# Patient Record
Sex: Female | Born: 1935
Health system: Southern US, Community
[De-identification: ages and names within clinical notes are randomized; demographics above are authoritative.]

## PROBLEM LIST (undated history)

## (undated) DIAGNOSIS — M1990 Unspecified osteoarthritis, unspecified site: Secondary | ICD-10-CM

## (undated) DIAGNOSIS — T4145XA Adverse effect of unspecified anesthetic, initial encounter: Secondary | ICD-10-CM

## (undated) DIAGNOSIS — K2100 Gastro-esophageal reflux disease with esophagitis, without bleeding: Secondary | ICD-10-CM

## (undated) DIAGNOSIS — R0789 Other chest pain: Secondary | ICD-10-CM

## (undated) DIAGNOSIS — E7800 Pure hypercholesterolemia, unspecified: Secondary | ICD-10-CM

## (undated) DIAGNOSIS — H3530 Unspecified macular degeneration: Secondary | ICD-10-CM

## (undated) DIAGNOSIS — E0590 Thyrotoxicosis, unspecified without thyrotoxic crisis or storm: Secondary | ICD-10-CM

## (undated) DIAGNOSIS — Z87442 Personal history of urinary calculi: Secondary | ICD-10-CM

## (undated) DIAGNOSIS — Z8709 Personal history of other diseases of the respiratory system: Secondary | ICD-10-CM

## (undated) DIAGNOSIS — N1831 Chronic kidney disease, stage 3a: Secondary | ICD-10-CM

## (undated) DIAGNOSIS — M4800 Spinal stenosis, site unspecified: Secondary | ICD-10-CM

## (undated) DIAGNOSIS — I10 Essential (primary) hypertension: Secondary | ICD-10-CM

## (undated) DIAGNOSIS — D649 Anemia, unspecified: Secondary | ICD-10-CM

## (undated) DIAGNOSIS — D509 Iron deficiency anemia, unspecified: Secondary | ICD-10-CM

## (undated) DIAGNOSIS — T7840XA Allergy, unspecified, initial encounter: Secondary | ICD-10-CM

## (undated) DIAGNOSIS — K5900 Constipation, unspecified: Secondary | ICD-10-CM

## (undated) DIAGNOSIS — N289 Disorder of kidney and ureter, unspecified: Secondary | ICD-10-CM

## (undated) DIAGNOSIS — H409 Unspecified glaucoma: Secondary | ICD-10-CM

## (undated) DIAGNOSIS — K7689 Other specified diseases of liver: Secondary | ICD-10-CM

## (undated) DIAGNOSIS — R200 Anesthesia of skin: Secondary | ICD-10-CM

## (undated) DIAGNOSIS — F5101 Primary insomnia: Secondary | ICD-10-CM

## (undated) DIAGNOSIS — M12812 Other specific arthropathies, not elsewhere classified, left shoulder: Secondary | ICD-10-CM

## (undated) DIAGNOSIS — G479 Sleep disorder, unspecified: Secondary | ICD-10-CM

## (undated) DIAGNOSIS — M75101 Unspecified rotator cuff tear or rupture of right shoulder, not specified as traumatic: Secondary | ICD-10-CM

## (undated) DIAGNOSIS — L719 Rosacea, unspecified: Secondary | ICD-10-CM

## (undated) DIAGNOSIS — R112 Nausea with vomiting, unspecified: Secondary | ICD-10-CM

## (undated) DIAGNOSIS — M545 Low back pain, unspecified: Secondary | ICD-10-CM

## (undated) DIAGNOSIS — M19011 Primary osteoarthritis, right shoulder: Secondary | ICD-10-CM

## (undated) DIAGNOSIS — K219 Gastro-esophageal reflux disease without esophagitis: Secondary | ICD-10-CM

## (undated) DIAGNOSIS — Z8701 Personal history of pneumonia (recurrent): Secondary | ICD-10-CM

## (undated) DIAGNOSIS — T8859XA Other complications of anesthesia, initial encounter: Secondary | ICD-10-CM

## (undated) DIAGNOSIS — K625 Hemorrhage of anus and rectum: Secondary | ICD-10-CM

## (undated) DIAGNOSIS — M75102 Unspecified rotator cuff tear or rupture of left shoulder, not specified as traumatic: Secondary | ICD-10-CM

## (undated) DIAGNOSIS — E7849 Other hyperlipidemia: Secondary | ICD-10-CM

## (undated) HISTORY — DX: Disorder of kidney and ureter, unspecified: N28.9

## (undated) HISTORY — DX: Allergy, unspecified, initial encounter: T78.40XA

## (undated) HISTORY — DX: Gastro-esophageal reflux disease with esophagitis, without bleeding: K21.00

## (undated) HISTORY — DX: Unspecified glaucoma: H40.9

## (undated) HISTORY — DX: Unspecified osteoarthritis, unspecified site: M19.90

## (undated) HISTORY — DX: Chronic kidney disease, stage 3a: N18.31

## (undated) HISTORY — DX: Other chest pain: R07.89

## (undated) HISTORY — PX: ABDOMINAL HYSTERECTOMY: SHX81

## (undated) HISTORY — DX: Rosacea, unspecified: L71.9

## (undated) HISTORY — DX: Anemia, unspecified: D64.9

## (undated) HISTORY — DX: Primary insomnia: F51.01

## (undated) HISTORY — DX: Hemorrhage of anus and rectum: K62.5

## (undated) HISTORY — DX: Pure hypercholesterolemia, unspecified: E78.00

## (undated) HISTORY — DX: Other specified diseases of liver: K76.89

## (undated) HISTORY — DX: Low back pain, unspecified: M54.50

## (undated) HISTORY — PX: JOINT REPLACEMENT: SHX530

## (undated) HISTORY — DX: Thyrotoxicosis, unspecified without thyrotoxic crisis or storm: E05.90

## (undated) HISTORY — PX: EYE SURGERY: SHX253

## (undated) HISTORY — DX: Other hyperlipidemia: E78.49

## (undated) HISTORY — PX: CARPAL TUNNEL RELEASE: SHX101

## (undated) HISTORY — DX: Constipation, unspecified: K59.00

## (undated) HISTORY — PX: OTHER SURGICAL HISTORY: SHX169

## (undated) HISTORY — DX: Sleep disorder, unspecified: G47.9

## (undated) HISTORY — PX: DILATION AND CURETTAGE OF UTERUS: SHX78

## (undated) HISTORY — DX: Essential (primary) hypertension: I10

## (undated) HISTORY — PX: KNEE SURGERY: SHX244

---

## 2008-06-11 ENCOUNTER — Encounter: Admission: RE | Admit: 2008-06-11 | Discharge: 2008-06-11 | Payer: Self-pay | Admitting: Orthopedic Surgery

## 2008-06-14 ENCOUNTER — Ambulatory Visit (HOSPITAL_BASED_OUTPATIENT_CLINIC_OR_DEPARTMENT_OTHER): Admission: RE | Admit: 2008-06-14 | Discharge: 2008-06-15 | Payer: Self-pay | Admitting: Orthopedic Surgery

## 2010-08-04 LAB — BASIC METABOLIC PANEL
BUN: 19 mg/dL (ref 6–23)
Creatinine, Ser: 0.91 mg/dL (ref 0.4–1.2)
GFR calc non Af Amer: >60 mL/min (ref >60)

## 2010-08-04 LAB — POCT HEMOGLOBIN-HEMACUE: Hemoglobin: 11 g/dL — ABNORMAL LOW (ref 12.0–15.0)

## 2010-09-01 NOTE — Op Note (Signed)
Patricia [name], [name]                 ACCOUNT NO.:  [id number]   MEDICAL RECORD NO.:  [id number]          PATIENT TYPE:  AMB   LOCATION:  DSC                          FACILITY:  [facility]   PHYSICIAN:  Eulas Post, MD    DATE OF BIRTH:  08-21-1934   DATE OF PROCEDURE:  06/14/2008  DATE OF DISCHARGE:                               OPERATIVE REPORT   PREOPERATIVE DIAGNOSIS:  Left shoulder massive irreparable rotator cuff  tear with biceps tendinosis, impingement, and acromioclavicular joint  arthritis.   POSTOPERATIVE DIAGNOSIS:  Left shoulder massive irreparable rotator cuff  tear with biceps tendinosis, impingement, and acromioclavicular joint  arthritis.   OPERATIVE PROCEDURE:  Left shoulder arthroscopy with open biceps  subpectoral tenodesis and extensive debridement with acromioplasty and  distal clavicle resection.   PREOPERATIVE INDICATIONS:  Patricia [name] is a 76 year old woman who  had severe rotator cuff deficiency with a massive tear and extensive  atrophy of the subscapularis, supraspinatus and infraspinatus.  She  elected to undergo the above-named procedures.  The risks, benefits and  alternatives were discussed with her preoperatively including  but not  limited to risks of infection, bleeding, nerve injury, recurrent pain,  stiffness, progression of rotator cuff arthropathy, anterior escape,  cardiopulmonary complications, among others and she is willing to  proceed.   OPERATIVE FINDINGS:  The bone on the humeral side was relatively well  maintained.  The cartilage on the glenoid had grade 1 to grade 2  changes.  The anterior acromion had some acetabularization, and had a  fairly extensive spur anteriorly that became almost confluent with the  CA ligament.  The [facility] joint had degenerative arthritis.  The rotator cuff  was massively torn and this included the majority of the infraspinatus,  all of the supraspinatus, and some of the subscapularis.  These tendons  were  retracted well back beyond the glenoid and there was nothing to  repair.  The biceps tendon had severe tendinosis and in hour glass type  shape and the biceps tendon was subluxated upon entry into the joint.   OPERATIVE PROCEDURE:  The patient was brought to the operating room and  placed in a supine position.  A sloppy lateral decubitus position was  applied.  General anesthesia was administered.  Regional block was given  in the holding area.  One gram of intravenous Ancef was given.  The left  upper extremity was prepped and draped in the usual sterile fashion.  Foley was placed.  Diagnostic arthroscopy was carried out with the above-  named findings.  Extensive debridement was carried out using a shaver  removing the bursa and the frayed portions of the rotator cuff.  The  biceps was tenolized using an arthroscopic biter.  The labrum was  debrided with the shaver.  The undersurface of the acromion was debrided  and an acromioplasty was carried out using the bur.  Care was taken to  preserve the CA ligament and shell this out.  I was less aggressive with  the acromioplasty than I would normally be, in hopes of not  destabilizing  the shoulder.  There still may be somewhat of a slight  hook, but if I took the entire hook, she would be left with no CA  ligament and increase the risk for anterior escape.  Therefore, I did  perform a light acromioplasty with care and caution.   We then turned our attention to the [facility] joint and the distal clavicle was  resected.  There were superior portions of bone that were also excised  that appeared to be significant bone spurs.   We then turned our attention to the biceps tendon.  It had already been  tenotomized and a small anterior incision was made over the anterior  shoulder.  Dissection was carried down through the deltopectoral  interval and the biceps was exposed and delivered through the wound.  A  5.5-mm peak anchor was placed.  The tendon was  stitched using the  FiberWire through the anchor and the tendon then secured down to bone  with the appropriate length and tension set.  The wounds were irrigated  copiously and the tendon cut and removed, leaving the secured portions  still in place.  After irrigation, the wounds were closed with Vicryl  followed by Monocryl for the skin and the portal sites.  Steri-Strips  and sterile gauze was applied.  The patient was awakened and returned to  the PACU in stable and satisfactory condition.  There were no  complications.  The patient tolerated the procedure well.      Eulas Post, MD  Electronically Signed     JPL/MEDQ  D:  06/14/2008  T:  06/14/2008  Job:  [id number]

## 2011-02-23 ENCOUNTER — Emergency Department (HOSPITAL_COMMUNITY): Payer: 59

## 2011-02-23 ENCOUNTER — Emergency Department (HOSPITAL_COMMUNITY)
Admission: EM | Admit: 2011-02-23 | Discharge: 2011-02-23 | Disposition: A | Discharge status: Home / Self Care | Payer: 59 | Attending: Orthopedic Surgery | Admitting: Orthopedic Surgery

## 2011-02-23 ENCOUNTER — Encounter: Payer: Self-pay | Admitting: Neurology

## 2011-02-23 DIAGNOSIS — S40029A Contusion of unspecified upper arm, initial encounter: Secondary | ICD-10-CM | POA: Insufficient documentation

## 2011-02-23 DIAGNOSIS — I10 Essential (primary) hypertension: Secondary | ICD-10-CM | POA: Insufficient documentation

## 2011-02-23 DIAGNOSIS — S42293A Other displaced fracture of upper end of unspecified humerus, initial encounter for closed fracture: Secondary | ICD-10-CM

## 2011-02-23 DIAGNOSIS — M25511 Pain in right shoulder: Secondary | ICD-10-CM

## 2011-02-23 DIAGNOSIS — Z7982 Long term (current) use of aspirin: Secondary | ICD-10-CM | POA: Insufficient documentation

## 2011-02-23 DIAGNOSIS — S46909A Unspecified injury of unspecified muscle, fascia and tendon at shoulder and upper arm level, unspecified arm, initial encounter: Secondary | ICD-10-CM | POA: Insufficient documentation

## 2011-02-23 DIAGNOSIS — Z79899 Other long term (current) drug therapy: Secondary | ICD-10-CM | POA: Insufficient documentation

## 2011-02-23 DIAGNOSIS — M25519 Pain in unspecified shoulder: Secondary | ICD-10-CM | POA: Insufficient documentation

## 2011-02-23 DIAGNOSIS — H409 Unspecified glaucoma: Secondary | ICD-10-CM | POA: Insufficient documentation

## 2011-02-23 DIAGNOSIS — S46001A Unspecified injury of muscle(s) and tendon(s) of the rotator cuff of right shoulder, initial encounter: Secondary | ICD-10-CM

## 2011-02-23 DIAGNOSIS — M129 Arthropathy, unspecified: Secondary | ICD-10-CM | POA: Insufficient documentation

## 2011-02-23 DIAGNOSIS — S4980XA Other specified injuries of shoulder and upper arm, unspecified arm, initial encounter: Secondary | ICD-10-CM | POA: Insufficient documentation

## 2011-02-23 DIAGNOSIS — X500XXA Overexertion from strenuous movement or load, initial encounter: Secondary | ICD-10-CM | POA: Insufficient documentation

## 2011-02-23 HISTORY — DX: Essential (primary) hypertension: I10

## 2011-02-23 HISTORY — DX: Spinal stenosis, site unspecified: M48.00

## 2011-02-23 HISTORY — DX: Unspecified macular degeneration: H35.30

## 2011-02-23 HISTORY — DX: Unspecified osteoarthritis, unspecified site: M19.90

## 2011-02-23 MED ORDER — BUPIVACAINE HCL (PF) 0.5 % IJ SOLN
INTRAMUSCULAR | Status: AC
  Administered 2011-02-23: 21:00:00
  Filled 2011-02-23: qty 10

## 2011-02-23 MED ORDER — TRIAMCINOLONE ACETONIDE 40 MG/ML IJ SUSP
40.0000 mg | Freq: Once | INTRAMUSCULAR | Status: AC
Start: 2011-02-23 — End: 2011-02-23
  Administered 2011-02-23: 40 mg via INTRA_ARTICULAR
  Filled 2011-02-23: qty 1

## 2011-02-23 NOTE — ED Provider Notes (Signed)
History     CSN: [id number] Arrival date & time: 02/23/2011  6:37 PM   First MD Initiated Contact with Patient 02/23/11 1848      Chief Complaint  Patient presents with  . Arm Injury    (Consider location/radiation/quality/duration/timing/severity/associated sxs/prior treatment) HPI  Past Medical History  Diagnosis Date  . Arthritis   . Hypertension   . Glaucoma   . Macular degeneration disease   . Spinal stenosis     Past Surgical History  Procedure Date  . Rotator cuff repair   . Eye surgery   . Abdominal hysterectomy   . Knee surgery   . Carpal tunnel release     No family history on file.  History  Substance Use Topics  . Smoking status: Never Smoker   . Smokeless tobacco: Not on file  . Alcohol Use: Yes     occasional    OB History    Grav Para Term Preterm Abortions TAB SAB Ect Mult Living                  Review of Systems  Allergies  Codeine; Fentanyl and related; Sulfa antibiotics; and Tramadol  Home Medications   Current Outpatient Rx  Name Route Sig Dispense Refill  . ACETAMINOPHEN 500 MG PO TABS Oral Take 1,000 mg by mouth every 6 (six) hours as needed.      . ASPIRIN EC 81 MG PO TBEC Oral Take 81 mg by mouth daily.      . ATENOLOL 25 MG PO TABS Oral Take 25 mg by mouth daily.      . COQ10 PO Oral Take 1 tablet by mouth daily.      . CYCLOBENZAPRINE HCL 10 MG PO TABS Oral Take 10 mg by mouth 2 (two) times daily as needed.      Marland Kitchen DICLOFENAC SODIUM 1 % TD GEL Topical Apply 1 application topically 4 (four) times daily.      . OMEGA-3 FATTY ACIDS 1000 MG PO CAPS Oral Take 1 g by mouth daily.      Marland Kitchen HYPROMELLOSE 2.5 % OP SOLN Both Eyes Place 1 drop into both eyes daily as needed.      Marland Kitchen LEVOTHYROXINE SODIUM 88 MCG PO TABS Oral Take 88 mcg by mouth daily.      Marland Kitchen LIDOCAINE 5 % EX PTCH Transdermal Place 1 patch onto the skin daily. Remove & Discard patch within 12 hours or as directed by MD     . LISINOPRIL 10 MG PO TABS Oral Take 10 mg by  mouth daily.      Carma Leaven M PLUS PO TABS Oral Take 1 tablet by mouth daily.      Marland Kitchen PRESERVISION AREDS PO CAPS Oral Take 1 capsule by mouth 2 (two) times daily.      Marland Kitchen OVER THE COUNTER MEDICATION Oral Take 1 capsule by mouth daily. Glucosamine with MSM     . PANTOPRAZOLE SODIUM 40 MG PO TBEC Oral Take 40 mg by mouth daily.        BP 131/69  Pulse 70  Temp(Src) 97.6 F (36.4 C) (Oral)  Resp 18  Ht 5\' 1"  (1.549 m)  Wt 128 lb (58.06 kg)  BMI 24.19 kg/m2  SpO2 99%  Physical Exam  ED Course  Procedures (including critical care time)  Labs Reviewed - No data to display Dg Shoulder Right  02/23/2011  *RADIOLOGY REPORT*  Clinical Data: Pain  RIGHT SHOULDER - 2+ VIEW  Comparison: None.  Findings: No acute fracture and no dislocation.  Prominent degenerative change of the [facility] joint.  Chronic rotator cuff tearing. Osteopenia.  IMPRESSION: No acute bony pathology.  Chronic change.  Original Report Authenticated By: Donavan Burnet, M.D.     1. Right shoulder pain   2. Injury of tendon of right rotator cuff   3. Hill-Sachs fracture       MDM  9:23 PM  Dr. Ave Filter at bedside injected shoulder and arranged for office follow up with patient and family       Arman Filter, NP 02/23/11 2124

## 2011-02-23 NOTE — ED Notes (Signed)
Pt alert and oriented. Arm wrapped in home made sling that pt reports decreases pain. Pt last received 1000 mg tylenol at 1645 for pain. Pt daughter at bedside. Vitals stable. Pt waiting to see Dr. Marsa Aris office.

## 2011-02-23 NOTE — Consult Note (Signed)
Reason for Consult:R shoulder pain Referring Physician: Dr. Opal Sidles Patricia [name] is an 76 y.o. female.  HPI: Patricia [name] is a patient of Dr. Dion Saucier who was in [place] and just returned this evening after a 26 hr trip.  She had a severe exacerbation of R shoulder pain after reaching up to get in a van 2 weeks ago.  She has noted some bruising along the biceps and anteriorly in shoulder.  Right now pain is very mild at rest , but significantly increased with certain movements.  No F/c/n/v.  No n/t.    Past Medical History  Diagnosis Date  . Arthritis   . Hypertension   . Glaucoma   . Macular degeneration disease   . Spinal stenosis     Past Surgical History  Procedure Date  . Rotator cuff repair   . Eye surgery   . Abdominal hysterectomy   . Knee surgery   . Carpal tunnel release     No family history on file.  Social History:  reports that she has never smoked. She does not have any smokeless tobacco history on file. She reports that she drinks alcohol. She reports that she does not use illicit drugs.  Allergies:  Allergies  Allergen Reactions  . Codeine Nausea And Vomiting  . Fentanyl And Related Nausea And Vomiting  . Sulfa Antibiotics Nausea And Vomiting  . Tramadol Nausea And Vomiting    Medications: I have reviewed the patient's current medications.  No results found for this or any previous visit (from the past 48 hour(s)).  Dg Shoulder Right  02/23/2011  *RADIOLOGY REPORT*  Clinical Data: Pain  RIGHT SHOULDER - 2+ VIEW  Comparison: None.  Findings: No acute fracture and no dislocation.  Prominent degenerative change of the [facility] joint.  Chronic rotator cuff tearing. Osteopenia.  IMPRESSION: No acute bony pathology.  Chronic change.  Original Report Authenticated By: Donavan Burnet, M.D.   I reviewed her MRI from [place] which shows massive cuff tear with significant synovitis. Report describes possible hill sachs fx.   Review of Systems  Musculoskeletal: Positive for  joint pain.  All other systems reviewed and are negative.   Blood pressure 145/81, pulse 77, temperature 97.6 F (36.4 C), temperature source Oral, resp. rate 16, height 5\' 1"  (1.549 m), weight 58.06 kg (128 lb), SpO2 100.00%. Physical Exam  Constitutional: She appears well-developed and well-nourished.  HENT:  Head: Atraumatic.  Eyes: EOM are normal.  Neck: Normal range of motion.  Cardiovascular: Intact distal pulses.   Respiratory: Effort normal.  Musculoskeletal:       Right shoulder: She exhibits decreased range of motion, tenderness, swelling and crepitus.       Arms: Neurological:       NVID bilateral UEs    Assessment/Plan:  76 yo female with acute exacerbation of chronic rotator cuff tear arthropathy, likely recent rupture of proximal long head biceps tendon.    -no acute intervention required, sx are already improving some -sling immobilizer for comfort -offered corticosteroid injection to help calm down acute exacerbation and she elected to proceed. Risks benefits discussed. Gave injection from anterior position with 22G 1.5 inch needle after sterile prep with alcohol.  Gave 1cc kenalog, 6 cc .5 % bupivicaine.  She tolerated well. -Recommend ice and Tylenol.  -F/u with myself or Dr. Dion Saucier within next 2 wks for recheck. -May need arthroplasty in future.  Patricia [name] 02/23/2011, 8:39 PM

## 2011-02-23 NOTE — ED Notes (Signed)
Pt reports 2 weeks ago was reaching to get into [place] and felt sudden pain. Pt iced arm and pain receded. Pt reports over next couple of days had several episodes of severe pain. Pt had scans done in [place] Nov. 1st, right humerus fx was discovered. Pt returned to [place] today via [employer]. At this time arm is wrapped is homemade sling.

## 2011-02-23 NOTE — ED Provider Notes (Signed)
History     CSN: [id number] Arrival date & time: 02/23/2011  6:37 PM    Chief Complaint  Patient presents with  . Arm Injury   HPI Pt was seen at 1850.  Per pt, c/o gradual onset and worsening of persistent right shoulder "pain" and "bruising" x2 weeks.  [place] was [place] working when she "reached up to get into a van" and felt the pain, followed by bruising.  States she rested her shoulder and iced it "but then got back to work" when it felt a little better.  Pt states the pain returned and persisted, she was seen by a MD in [place] and had an MRI that revealed an extensive right rotator cuff tear as well as a Hill Sachs fracture.  Pt has been wearing a sling for comfort.  Ortho MD Dion Saucier was called and accepted pt in transfer back to the [place].  Pt presents to the ED today to be eval by Ortho MD.  Pt denies new injury, no CP/SOB, no fevers, no back pain, no tingling/numbness in extremity.      Past Medical History  Diagnosis Date  . Arthritis   . Hypertension   . Glaucoma   . Macular degeneration disease   . Spinal stenosis     Past Surgical History  Procedure Date  . Rotator cuff repair   . Eye surgery   . Abdominal hysterectomy   . Knee surgery   . Carpal tunnel release     History  Substance Use Topics  . Smoking status: Never Smoker   . Smokeless tobacco: Not on file  . Alcohol Use: Yes     occasional     Review of Systems ROS: Statement: All systems negative except as marked or noted in the HPI; Constitutional: Negative for fever and chills. ; ; Eyes: Negative for eye pain, redness and discharge. ; ; ENMT: Negative for ear pain, hoarseness, nasal congestion, sinus pressure and sore throat. ; ; Cardiovascular: Negative for chest pain, palpitations, diaphoresis, dyspnea and peripheral edema. ; ; Respiratory: Negative for cough, wheezing and stridor. ; ; Gastrointestinal: Negative for nausea, vomiting, diarrhea and abdominal pain, blood in stool, hematemesis, jaundice  and rectal bleeding. . ; ; Genitourinary: Negative for dysuria, flank pain and hematuria. ; ; Musculoskeletal: Negative for back pain and neck pain. Negative for swelling.  +right shoulder pain.; ; Skin: +bruising.  Negative for pruritus, rash, abrasions, blisters,and skin lesion.; ; Neuro: Negative for headache, lightheadedness and neck stiffness. Negative for weakness, altered level of consciousness , altered mental status, extremity weakness, paresthesias, involuntary movement, seizure and syncope.     Allergies  Codeine; Fentanyl and related; Sulfa antibiotics; and Tramadol  Home Medications  No current outpatient prescriptions on file.  BP 145/81  Pulse 77  Temp(Src) 97.6 F (36.4 C) (Oral)  Resp 16  Ht 5\' 1"  (1.549 m)  Wt 128 lb (58.06 kg)  BMI 24.19 kg/m2  SpO2 100%  Physical Exam 1855: Physical examination:  Nursing notes reviewed; Vital signs and O2 SAT reviewed;  Constitutional: Well developed, Well nourished, Well hydrated, In no acute distress; Head:  Normocephalic, atraumatic; Eyes: EOMI, PERRL, No scleral icterus; ENMT: Mouth and pharynx normal, Mucous membranes moist; Neck: Supple, Full range of motion, No lymphadenopathy; Cardiovascular: Regular rate and rhythm, No murmur, rub, or gallop; Respiratory: Breath sounds clear & equal bilaterally, No rales, rhonchi, wheezes, or rub, Normal respiratory effort/excursion; Chest: Nontender, Movement normal; Extremities: Pulses normal, +TTP right shoulder with fading  ecchymosis, shoulder immobilizer in place.  NMS intact right hand.  No calf edema or asymmetry.; Neuro: AA&Ox3, Major CN grossly intact.  No gross focal motor or sensory deficits in extremities.; Skin: Color normal, Warm, Dry.    ED Course  Procedures  1900:  Pt requests I do not take off her shoulder immobilizer for further exam because "it makes it hurt too much."  Will comply with pt's wishes.   MDM  MDM Reviewed: nursing note and vitals Reviewed previous: x-ray  and MRI Interpretation: x-ray   Dg Shoulder Right  02/23/2011  *RADIOLOGY REPORT*  Clinical Data: Pain  RIGHT SHOULDER - 2+ VIEW  Comparison: None.  Findings: No acute fracture and no dislocation.  Prominent degenerative change of the [facility] joint.  Chronic rotator cuff tearing. Osteopenia.  IMPRESSION: No acute bony pathology.  Chronic change.  Original Report Authenticated By: Donavan Burnet, M.D.    1945:  T/C to Ortho Dr. Ave Filter, case discussed, including:  HPI, pertinent PM/SHx, VS/PE, dx testing, ED course and treatment.  Agreeable to come to ED for pt eval.      [facility] Allison Quarry, DO 02/25/11 [id number]

## 2011-02-23 NOTE — ED Notes (Signed)
Pt resting quietly at the time. Denies pain. Orthopedic consult at bedside. Vital signs stable. Pt remains alert and oriented x4. No signs of distress.

## 2011-02-23 NOTE — ED Notes (Signed)
Dr. Ave Filter at the bedside administering kenalog injection. Pt tolerated without difficulty. To be discharged home.

## 2011-02-23 NOTE — ED Provider Notes (Signed)
Medical screening examination/treatment/procedure(s) were performed by non-physician practitioner and as supervising physician I was immediately available for consultation/collaboration.   Glynn Octave, MD 02/23/11 2351

## 2011-02-23 NOTE — Progress Notes (Signed)
Orthopedic Tech Progress Note Patient Details:  Patricia [name] Sep 12, 1934 [id number]  Other Ortho Devices Type of Ortho Device: Other (comment) (shoulder immobilizer) Ortho Device Location: right shoulder Ortho Device Interventions: Application   Patricia [name] 02/23/2011, 9:07 PM

## 2011-03-02 ENCOUNTER — Other Ambulatory Visit: Payer: Self-pay | Admitting: Orthopedic Surgery

## 2011-03-04 ENCOUNTER — Other Ambulatory Visit: Payer: Self-pay | Admitting: Orthopedic Surgery

## 2011-03-04 ENCOUNTER — Encounter (HOSPITAL_BASED_OUTPATIENT_CLINIC_OR_DEPARTMENT_OTHER): Payer: Self-pay | Admitting: *Deleted

## 2011-03-04 NOTE — Progress Notes (Signed)
To bring all mer\ds Overnight bag Stayed csc 10 with other shoulder

## 2011-03-05 ENCOUNTER — Encounter (HOSPITAL_BASED_OUTPATIENT_CLINIC_OR_DEPARTMENT_OTHER)
Admission: RE | Admit: 2011-03-05 | Discharge: 2011-03-05 | Disposition: A | Discharge status: Home / Self Care | Payer: 59 | Source: Ambulatory Visit | Attending: Orthopedic Surgery | Admitting: Orthopedic Surgery

## 2011-03-05 ENCOUNTER — Other Ambulatory Visit: Payer: Self-pay | Admitting: *Deleted

## 2011-03-05 ENCOUNTER — Other Ambulatory Visit: Payer: Self-pay | Admitting: Orthopedic Surgery

## 2011-03-05 ENCOUNTER — Other Ambulatory Visit: Payer: Self-pay

## 2011-03-05 ENCOUNTER — Other Ambulatory Visit: Payer: Managed Care, Other (non HMO)

## 2011-03-05 ENCOUNTER — Ambulatory Visit
Admission: RE | Admit: 2011-03-05 | Discharge: 2011-03-05 | Disposition: A | Discharge status: Home / Self Care | Payer: Medicare Other | Source: Ambulatory Visit | Attending: Orthopedic Surgery | Admitting: Orthopedic Surgery

## 2011-03-05 DIAGNOSIS — Z01811 Encounter for preprocedural respiratory examination: Secondary | ICD-10-CM

## 2011-03-05 LAB — BASIC METABOLIC PANEL
GFR calc Af Amer: >90 mL/min (ref >90)
GFR calc non Af Amer: 80 mL/min — ABNORMAL LOW (ref >90)
Glucose, Bld: 77 mg/dL (ref 70–99)
Potassium: 4.2 mEq/L (ref 3.5–5.1)
Sodium: 135 mEq/L (ref 135–145)

## 2011-03-05 LAB — URINALYSIS, ROUTINE W REFLEX MICROSCOPIC
Glucose, UA: NEGATIVE mg/dL
Leukocytes, UA: NEGATIVE
Specific Gravity, Urine: 1.011 (ref 1.005–1.030)
pH: 6 (ref 5.0–8.0)

## 2011-03-05 LAB — URINE MICROSCOPIC-ADD ON

## 2011-03-05 LAB — CBC
Hemoglobin: 10.8 g/dL — ABNORMAL LOW (ref 12.0–15.0)
RBC: 3.68 MIL/uL — ABNORMAL LOW (ref 3.87–5.11)

## 2011-03-05 LAB — APTT: aPTT: 32 seconds (ref 24–37)

## 2011-03-05 LAB — PROTIME-INR: Prothrombin Time: 13.6 seconds (ref 11.6–15.2)

## 2011-03-06 ENCOUNTER — Encounter (HOSPITAL_BASED_OUTPATIENT_CLINIC_OR_DEPARTMENT_OTHER): Payer: Self-pay | Admitting: Orthopedic Surgery

## 2011-03-06 DIAGNOSIS — M19011 Primary osteoarthritis, right shoulder: Secondary | ICD-10-CM

## 2011-03-06 DIAGNOSIS — M75101 Unspecified rotator cuff tear or rupture of right shoulder, not specified as traumatic: Secondary | ICD-10-CM

## 2011-03-06 HISTORY — DX: Primary osteoarthritis, right shoulder: M19.011

## 2011-03-06 HISTORY — DX: Unspecified rotator cuff tear or rupture of right shoulder, not specified as traumatic: M75.101

## 2011-03-06 NOTE — H&P (Signed)
PREOPERATIVE H&P  Chief Complaint: right shoulder pain  HPI: Patricia [name] is a 76 y.o. female who presents for preoperative history and physical with a diagnosis of rotator cuff arthropathy. Symptoms are rated as moderate to severe, and have been worsening since her recent trip to [place]. She has failed conservative measures, and have elected for surgical management.   Past Medical History  Diagnosis Date  . Arthritis   . Hypertension   . Glaucoma   . Macular degeneration disease   . Spinal stenosis   . Complication of anesthesia     nausea from fentayl  . Spinal stenosis    Past Surgical History  Procedure Date  . Rotator cuff repair   . Abdominal hysterectomy   . Knee surgery   . Carpal tunnel release   . Eye surgery     cataract-lt  . Joint replacement     lt knee-05   History   Social History  . Marital Status: Widowed    Spouse Name: N/A    Number of Children: N/A  . Years of Education: N/A   Social History Main Topics  . Smoking status: Never Smoker   . Smokeless tobacco: None  . Alcohol Use: Yes     occasional  . Drug Use: No  . Sexually Active:    Other Topics Concern  . None   Social History Narrative  . None   History reviewed. No pertinent family history. Allergies  Allergen Reactions  . Codeine Nausea And Vomiting  . Fentanyl And Related Nausea And Vomiting  . Sulfa Antibiotics Nausea And Vomiting  . Tramadol Nausea And Vomiting   Prior to Admission medications   Medication Sig Start Date End Date Taking? Authorizing Provider  fexofenadine (ALLEGRA) 180 MG tablet Take 180 mg by mouth daily.     Yes Historical Provider, MD  glucosamine-chondroitin 500-400 MG tablet Take 1 tablet by mouth 3 (three) times daily.     Yes Historical Provider, MD  acetaminophen (TYLENOL) 500 MG tablet Take 1,000 mg by mouth every 6 (six) hours as needed.      Historical Provider, MD  aspirin EC 81 MG tablet Take 81 mg by mouth daily.      Historical Provider, MD    atenolol (TENORMIN) 25 MG tablet Take 25 mg by mouth daily.      Historical Provider, MD  Coenzyme Q10 (COQ10 PO) Take 1 tablet by mouth daily.      Historical Provider, MD  cyclobenzaprine (FLEXERIL) 10 MG tablet Take 10 mg by mouth 2 (two) times daily as needed.      Historical Provider, MD  diclofenac sodium (VOLTAREN) 1 % GEL Apply 1 application topically 4 (four) times daily.      Historical Provider, MD  fish oil-omega-3 fatty acids 1000 MG capsule Take 1 g by mouth daily.      Historical Provider, MD  hydroxypropyl methylcellulose (ISOPTO TEARS) 2.5 % ophthalmic solution Place 1 drop into both eyes daily as needed.      Historical Provider, MD  levothyroxine (SYNTHROID, LEVOTHROID) 88 MCG tablet Take 100 mcg by mouth daily.     Historical Provider, MD  lidocaine (LIDODERM) 5 % Place 1 patch onto the skin daily. Remove & Discard patch within 12 hours or as directed by MD     Historical Provider, MD  lisinopril (PRINIVIL,ZESTRIL) 10 MG tablet Take 10 mg by mouth daily.      Historical Provider, MD  Multiple Vitamins-Minerals (MULTIVITAMINS THER. W/MINERALS) TABS Take  1 tablet by mouth daily.      Historical Provider, MD  Multiple Vitamins-Minerals (PRESERVISION AREDS) CAPS Take 1 capsule by mouth 2 (two) times daily.      Historical Provider, MD  OVER THE COUNTER MEDICATION Take 1 capsule by mouth daily. Glucosamine with MSM     Historical Provider, MD  pantoprazole (PROTONIX) 40 MG tablet Take 40 mg by mouth daily.      Historical Provider, MD     Positive ROS: All other systems have been reviewed and were otherwise negative with the exception of those mentioned in the HPI and as above.  Physical Exam:  General: Alert, no acute distress Cardiovascular: No pedal edema Respiratory: No cyanosis, no use of accessory musculature GI: No organomegaly, abdomen is soft and non-tender Skin: No lesions in the area of chief complaint Neurologic: Sensation intact distally Psychiatric: Patient is  competent for consent with normal mood and affect Lymphatic: No axillary or cervical lymphadenopathy  MUSCULOSKELETAL: active right shoulder flexion 0-20 degrees with psudoparalysis.  Severe weakness with rotator cuff testing.  Assessment/Plan: Rotator cuff arthropathy Plan for Procedure(s): Reverse TOTAL SHOULDER ARTHROPLASTY  The risks benefits and alternatives were discussed with the patient including but not limited to the risks of nonoperative treatment, versus surgical intervention including infection, bleeding, nerve injury,  blood clots, cardiopulmonary complications, morbidity, mortality, among others, and they were willing to proceed. Predicted outcome is good, although there will be at least a six to nine month expected recovery.  Shealeigh Dunstan P 03/06/2011 9:55 AM

## 2011-03-09 ENCOUNTER — Encounter (HOSPITAL_BASED_OUTPATIENT_CLINIC_OR_DEPARTMENT_OTHER): Payer: Self-pay | Admitting: *Deleted

## 2011-03-09 ENCOUNTER — Ambulatory Visit (HOSPITAL_BASED_OUTPATIENT_CLINIC_OR_DEPARTMENT_OTHER): Payer: 59 | Admitting: *Deleted

## 2011-03-09 ENCOUNTER — Encounter (HOSPITAL_BASED_OUTPATIENT_CLINIC_OR_DEPARTMENT_OTHER): Payer: Self-pay | Admitting: Anesthesiology

## 2011-03-09 ENCOUNTER — Encounter (HOSPITAL_COMMUNITY)
Admission: RE | Disposition: A | Discharge status: Home Health | Payer: Self-pay | Source: Ambulatory Visit | Attending: Orthopedic Surgery

## 2011-03-09 ENCOUNTER — Inpatient Hospital Stay (HOSPITAL_BASED_OUTPATIENT_CLINIC_OR_DEPARTMENT_OTHER)
Admission: RE | Admit: 2011-03-09 | Discharge: 2011-03-11 | DRG: 483 | Disposition: A | Discharge status: Home Health | Payer: 59 | Source: Ambulatory Visit | Attending: Orthopedic Surgery | Admitting: Orthopedic Surgery

## 2011-03-09 DIAGNOSIS — M19019 Primary osteoarthritis, unspecified shoulder: Principal | ICD-10-CM | POA: Diagnosis present

## 2011-03-09 DIAGNOSIS — D62 Acute posthemorrhagic anemia: Secondary | ICD-10-CM | POA: Diagnosis not present

## 2011-03-09 DIAGNOSIS — I9589 Other hypotension: Secondary | ICD-10-CM | POA: Diagnosis not present

## 2011-03-09 DIAGNOSIS — R58 Hemorrhage, not elsewhere classified: Secondary | ICD-10-CM

## 2011-03-09 DIAGNOSIS — H3530 Unspecified macular degeneration: Secondary | ICD-10-CM | POA: Diagnosis present

## 2011-03-09 DIAGNOSIS — I10 Essential (primary) hypertension: Secondary | ICD-10-CM | POA: Diagnosis present

## 2011-03-09 DIAGNOSIS — Z79899 Other long term (current) drug therapy: Secondary | ICD-10-CM

## 2011-03-09 DIAGNOSIS — M19011 Primary osteoarthritis, right shoulder: Secondary | ICD-10-CM | POA: Diagnosis present

## 2011-03-09 DIAGNOSIS — M75101 Unspecified rotator cuff tear or rupture of right shoulder, not specified as traumatic: Secondary | ICD-10-CM | POA: Diagnosis present

## 2011-03-09 DIAGNOSIS — Z96659 Presence of unspecified artificial knee joint: Secondary | ICD-10-CM

## 2011-03-09 DIAGNOSIS — Z7982 Long term (current) use of aspirin: Secondary | ICD-10-CM

## 2011-03-09 DIAGNOSIS — H409 Unspecified glaucoma: Secondary | ICD-10-CM | POA: Diagnosis present

## 2011-03-09 HISTORY — PX: REVERSE SHOULDER ARTHROPLASTY: SHX5054

## 2011-03-09 HISTORY — DX: Other complications of anesthesia, initial encounter: T88.59XA

## 2011-03-09 HISTORY — DX: Unspecified rotator cuff tear or rupture of right shoulder, not specified as traumatic: M75.101

## 2011-03-09 HISTORY — DX: Adverse effect of unspecified anesthetic, initial encounter: T41.45XA

## 2011-03-09 HISTORY — DX: Primary osteoarthritis, right shoulder: M19.011

## 2011-03-09 LAB — POCT HEMOGLOBIN-HEMACUE: Hemoglobin: 8.5 g/dL — ABNORMAL LOW (ref 12.0–15.0)

## 2011-03-09 SURGERY — ARTHROPLASTY, SHOULDER, TOTAL, REVERSE
Anesthesia: General | Site: Shoulder | Laterality: Right | Wound class: Clean

## 2011-03-09 MED ORDER — ACETAMINOPHEN 650 MG RE SUPP: 650.0000 mg | Freq: Four times a day (QID) | RECTAL | Status: DC | PRN

## 2011-03-09 MED ORDER — ONDANSETRON HCL 4 MG/2ML IJ SOLN: 4.0000 mg | Freq: Four times a day (QID) | INTRAMUSCULAR | Status: DC | PRN

## 2011-03-09 MED ORDER — LISINOPRIL 10 MG PO TABS
10.0000 mg | ORAL_TABLET | Freq: Every day | ORAL | Status: DC
  Filled 2011-03-09 (×2): qty 1

## 2011-03-09 MED ORDER — ATENOLOL 25 MG PO TABS
25.0000 mg | ORAL_TABLET | Freq: Every day | ORAL | Status: DC
  Filled 2011-03-09 (×2): qty 1

## 2011-03-09 MED ORDER — SCOPOLAMINE 1 MG/3DAYS TD PT72
1.0000 | MEDICATED_PATCH | TRANSDERMAL | Status: DC
  Administered 2011-03-09: 1.5 mg via TRANSDERMAL

## 2011-03-09 MED ORDER — CEFAZOLIN SODIUM 1-5 GM-% IV SOLN
1.0000 g | INTRAVENOUS | Status: AC
  Administered 2011-03-09: 1 g via INTRAVENOUS

## 2011-03-09 MED ORDER — ACETAMINOPHEN 325 MG PO TABS
650.0000 mg | ORAL_TABLET | Freq: Four times a day (QID) | ORAL | Status: DC | PRN
  Administered 2011-03-10: 650 mg via ORAL
  Filled 2011-03-09 (×2): qty 2

## 2011-03-09 MED ORDER — PHENYLEPHRINE HCL 10 MG/ML IJ SOLN
10.0000 mg | INTRAVENOUS | Status: DC | PRN
  Administered 2011-03-09: 20 ug via INTRAVENOUS

## 2011-03-09 MED ORDER — METOCLOPRAMIDE HCL 5 MG PO TABS
5.0000 mg | ORAL_TABLET | Freq: Three times a day (TID) | ORAL | Status: DC | PRN
  Filled 2011-03-09: qty 2

## 2011-03-09 MED ORDER — METHOCARBAMOL 500 MG PO TABS
500.0000 mg | ORAL_TABLET | Freq: Four times a day (QID) | ORAL | Status: DC | PRN
  Filled 2011-03-09: qty 1

## 2011-03-09 MED ORDER — OXYCODONE-ACETAMINOPHEN 5-325 MG PO TABS: 1.0000 | ORAL_TABLET | ORAL | Status: DC | PRN

## 2011-03-09 MED ORDER — THERA M PLUS PO TABS
1.0000 | ORAL_TABLET | Freq: Every day | ORAL | Status: DC
  Administered 2011-03-10 – 2011-03-11 (×2): 1 via ORAL
  Filled 2011-03-09 (×3): qty 1

## 2011-03-09 MED ORDER — HYDROMORPHONE HCL PF 1 MG/ML IJ SOLN: 0.2500 mg | INTRAMUSCULAR | Status: DC | PRN

## 2011-03-09 MED ORDER — ACETAMINOPHEN 500 MG PO TABS
1000.0000 mg | ORAL_TABLET | Freq: Four times a day (QID) | ORAL | Status: DC | PRN
Start: 2011-03-09 — End: 2011-03-11
  Administered 2011-03-10: 1000 mg via ORAL

## 2011-03-09 MED ORDER — LACTATED RINGERS IV SOLN
INTRAVENOUS | Status: DC
  Administered 2011-03-09 (×2): via INTRAVENOUS

## 2011-03-09 MED ORDER — PHENOL 1.4 % MT LIQD: 1.0000 | OROMUCOSAL | Status: DC | PRN

## 2011-03-09 MED ORDER — HYPROMELLOSE (GONIOSCOPIC) 2.5 % OP SOLN: 1.0000 [drp] | Freq: Every day | OPHTHALMIC | Status: DC | PRN

## 2011-03-09 MED ORDER — BISACODYL 10 MG RE SUPP: 10.0000 mg | Freq: Every day | RECTAL | Status: DC | PRN

## 2011-03-09 MED ORDER — DIPHENHYDRAMINE HCL 12.5 MG/5ML PO ELIX
12.5000 mg | ORAL_SOLUTION | ORAL | Status: DC | PRN
  Filled 2011-03-09: qty 10

## 2011-03-09 MED ORDER — BISACODYL 5 MG PO TBEC: 10.0000 mg | DELAYED_RELEASE_TABLET | Freq: Every day | ORAL | Status: DC | PRN

## 2011-03-09 MED ORDER — ROCURONIUM BROMIDE 100 MG/10ML IV SOLN
INTRAVENOUS | Status: DC | PRN
  Administered 2011-03-09: 50 mg via INTRAVENOUS

## 2011-03-09 MED ORDER — HETASTARCH-ELECTROLYTES 6 % IV SOLN
INTRAVENOUS | Status: DC | PRN
  Administered 2011-03-09: 14:00:00 via INTRAVENOUS

## 2011-03-09 MED ORDER — DEXAMETHASONE SODIUM PHOSPHATE 4 MG/ML IJ SOLN
INTRAMUSCULAR | Status: DC | PRN
  Administered 2011-03-09: 10 mg via INTRAVENOUS

## 2011-03-09 MED ORDER — LIDOCAINE HCL (CARDIAC) 20 MG/ML IV SOLN
INTRAVENOUS | Status: DC | PRN
  Administered 2011-03-09: 60 mg via INTRAVENOUS

## 2011-03-09 MED ORDER — SENNOSIDES-DOCUSATE SODIUM 8.6-50 MG PO TABS
1.0000 | ORAL_TABLET | Freq: Every evening | ORAL | Status: DC | PRN
  Filled 2011-03-09: qty 1

## 2011-03-09 MED ORDER — BUPIVACAINE-EPINEPHRINE PF 0.5-1:200000 % IJ SOLN
INTRAMUSCULAR | Status: DC | PRN
  Administered 2011-03-09: 30 mL

## 2011-03-09 MED ORDER — LEVOTHYROXINE SODIUM 100 MCG PO TABS
100.0000 ug | ORAL_TABLET | Freq: Every day | ORAL | Status: DC
  Administered 2011-03-10 – 2011-03-11 (×2): 100 ug via ORAL
  Filled 2011-03-09 (×3): qty 1

## 2011-03-09 MED ORDER — MIDAZOLAM HCL 2 MG/2ML IJ SOLN
1.0000 mg | INTRAMUSCULAR | Status: DC | PRN
  Administered 2011-03-09: 2 mg via INTRAVENOUS

## 2011-03-09 MED ORDER — MENTHOL 3 MG MT LOZG: 1.0000 | LOZENGE | OROMUCOSAL | Status: DC | PRN

## 2011-03-09 MED ORDER — FLEET ENEMA 7-19 GM/118ML RE ENEM
1.0000 | ENEMA | Freq: Every day | RECTAL | Status: DC | PRN
  Filled 2011-03-09: qty 1

## 2011-03-09 MED ORDER — PANTOPRAZOLE SODIUM 40 MG PO TBEC
40.0000 mg | DELAYED_RELEASE_TABLET | Freq: Every day | ORAL | Status: DC
  Administered 2011-03-10 – 2011-03-11 (×2): 40 mg via ORAL
  Filled 2011-03-09 (×2): qty 1

## 2011-03-09 MED ORDER — PROPOFOL 10 MG/ML IV EMUL
INTRAVENOUS | Status: DC | PRN
  Administered 2011-03-09: 150 mg via INTRAVENOUS

## 2011-03-09 MED ORDER — ONDANSETRON HCL 4 MG/2ML IJ SOLN
INTRAMUSCULAR | Status: DC | PRN
  Administered 2011-03-09: 4 mg via INTRAVENOUS

## 2011-03-09 MED ORDER — ZOLPIDEM TARTRATE 5 MG PO TABS: 5.0000 mg | ORAL_TABLET | Freq: Every evening | ORAL | Status: DC | PRN

## 2011-03-09 MED ORDER — METOCLOPRAMIDE HCL 5 MG/ML IJ SOLN
5.0000 mg | Freq: Three times a day (TID) | INTRAMUSCULAR | Status: DC | PRN
  Filled 2011-03-09: qty 2

## 2011-03-09 MED ORDER — MAGNESIUM HYDROXIDE 400 MG/5ML PO SUSP: 30.0000 mL | Freq: Two times a day (BID) | ORAL | Status: DC | PRN

## 2011-03-09 MED ORDER — POTASSIUM CHLORIDE IN NACL 20-0.45 MEQ/L-% IV SOLN
INTRAVENOUS | Status: DC
  Administered 2011-03-10: 03:00:00 via INTRAVENOUS
  Filled 2011-03-09 (×4): qty 1000

## 2011-03-09 MED ORDER — FENTANYL CITRATE 0.05 MG/ML IJ SOLN: 50.0000 ug | INTRAMUSCULAR | Status: DC | PRN

## 2011-03-09 MED ORDER — CHLORHEXIDINE GLUCONATE 4 % EX LIQD: 60.0000 mL | Freq: Once | CUTANEOUS | Status: DC

## 2011-03-09 MED ORDER — CEFAZOLIN SODIUM 1-5 GM-% IV SOLN
1.0000 g | Freq: Four times a day (QID) | INTRAVENOUS | Status: AC
  Administered 2011-03-09 – 2011-03-10 (×2): 1 g via INTRAVENOUS
  Filled 2011-03-09: qty 50

## 2011-03-09 MED ORDER — CYCLOBENZAPRINE HCL 10 MG PO TABS
10.0000 mg | ORAL_TABLET | Freq: Three times a day (TID) | ORAL | Status: DC | PRN
  Administered 2011-03-10 – 2011-03-11 (×2): 10 mg via ORAL
  Filled 2011-03-09 (×2): qty 1

## 2011-03-09 MED ORDER — OXYCODONE HCL 5 MG PO TABS
5.0000 mg | ORAL_TABLET | ORAL | Status: DC | PRN
  Administered 2011-03-10 – 2011-03-11 (×3): 5 mg via ORAL
  Administered 2011-03-11: 10 mg via ORAL
  Filled 2011-03-09: qty 2
  Filled 2011-03-09 (×2): qty 1
  Filled 2011-03-09: qty 2

## 2011-03-09 MED ORDER — DOCUSATE SODIUM 100 MG PO CAPS
100.0000 mg | ORAL_CAPSULE | Freq: Two times a day (BID) | ORAL | Status: DC
  Administered 2011-03-10 – 2011-03-11 (×2): 100 mg via ORAL
  Filled 2011-03-09 (×4): qty 1

## 2011-03-09 MED ORDER — ASPIRIN EC 81 MG PO TBEC
81.0000 mg | DELAYED_RELEASE_TABLET | Freq: Every day | ORAL | Status: DC
  Administered 2011-03-10 – 2011-03-11 (×2): 81 mg via ORAL
  Filled 2011-03-09 (×2): qty 1

## 2011-03-09 MED ORDER — KCL IN DEXTROSE-NACL 20-5-0.45 MEQ/L-%-% IV SOLN
INTRAVENOUS | Status: DC
  Administered 2011-03-09: 18:00:00 via INTRAVENOUS

## 2011-03-09 MED ORDER — LORATADINE 10 MG PO TABS
10.0000 mg | ORAL_TABLET | Freq: Every day | ORAL | Status: DC
  Administered 2011-03-11: 10 mg via ORAL
  Filled 2011-03-09 (×2): qty 1

## 2011-03-09 MED ORDER — OXYCODONE-ACETAMINOPHEN 5-325 MG PO TABS: 1.0000 | ORAL_TABLET | Freq: Four times a day (QID) | ORAL | Status: AC | PRN

## 2011-03-09 MED ORDER — METHOCARBAMOL 500 MG PO TABS: 500.0000 mg | ORAL_TABLET | Freq: Four times a day (QID) | ORAL | Status: AC

## 2011-03-09 MED ORDER — METHOCARBAMOL 100 MG/ML IJ SOLN
500.0000 mg | Freq: Four times a day (QID) | INTRAVENOUS | Status: DC | PRN
  Filled 2011-03-09: qty 5

## 2011-03-09 MED ORDER — HYDROMORPHONE HCL PF 1 MG/ML IJ SOLN: 0.5000 mg | INTRAMUSCULAR | Status: DC | PRN

## 2011-03-09 MED ORDER — ONDANSETRON HCL 4 MG PO TABS: 4.0000 mg | ORAL_TABLET | Freq: Four times a day (QID) | ORAL | Status: DC | PRN

## 2011-03-09 MED ORDER — LIDOCAINE 5 % EX PTCH
1.0000 | MEDICATED_PATCH | CUTANEOUS | Status: DC
  Administered 2011-03-10: 1 via TRANSDERMAL
  Filled 2011-03-09 (×2): qty 1

## 2011-03-09 MED ORDER — HYDROMORPHONE HCL PF 1 MG/ML IJ SOLN
0.5000 mg | INTRAMUSCULAR | Status: DC | PRN
  Administered 2011-03-09: 0.5 mg via INTRAVENOUS

## 2011-03-09 MED ORDER — POLYETHYLENE GLYCOL 3350 17 G PO PACK
17.0000 g | PACK | Freq: Every day | ORAL | Status: DC | PRN
  Filled 2011-03-09: qty 1

## 2011-03-09 MED ORDER — PROMETHAZINE HCL 25 MG PO TABS: 25.0000 mg | ORAL_TABLET | Freq: Four times a day (QID) | ORAL | Status: DC | PRN

## 2011-03-09 SURGICAL SUPPLY — 73 items
2.2MM PERIPHERAL SCRW DRILL BIT ×3 IMPLANT
3.2 MM CENTRAL SCREW DRILL BIT ×3 IMPLANT
BENZOIN TINCTURE PRP APPL 2/3 (GAUZE/BANDAGES/DRESSINGS) ×3 IMPLANT
BLADE SURG 10 STRL SS (BLADE) IMPLANT
BLADE SURG 15 STRL LF DISP TIS (BLADE) ×6 IMPLANT
BLADE SURG 15 STRL SS (BLADE) ×3
CANISTER SUCTION 2500CC (MISCELLANEOUS) IMPLANT
CLEANER CAUTERY TIP 5X5 PAD (MISCELLANEOUS) IMPLANT
CLOTH BEACON ORANGE TIMEOUT ST (SAFETY) ×3 IMPLANT
DECANTER SPIKE VIAL GLASS SM (MISCELLANEOUS) IMPLANT
DRAPE INCISE IOBAN 66X45 STRL (DRAPES) ×3 IMPLANT
DRAPE SURG 17X23 STRL (DRAPES) ×3 IMPLANT
DRAPE U 20/CS (DRAPES) ×3 IMPLANT
DRAPE U-SHAPE 47X51 STRL (DRAPES) ×3 IMPLANT
DRAPE U-SHAPE 76X120 STRL (DRAPES) ×3 IMPLANT
DRSG PAD ABDOMINAL 8X10 ST (GAUZE/BANDAGES/DRESSINGS) ×3 IMPLANT
DURAPREP 26ML APPLICATOR (WOUND CARE) ×6 IMPLANT
ELECT BLADE 6.5 .24CM SHAFT (ELECTRODE) IMPLANT
ELECT REM PT RETURN 9FT ADLT (ELECTROSURGICAL) ×3
ELECTRODE REM PT RTRN 9FT ADLT (ELECTROSURGICAL) ×2 IMPLANT
GAUZE SPONGE 4X4 16PLY XRAY LF (GAUZE/BANDAGES/DRESSINGS) ×3 IMPLANT
GAUZE XEROFORM 1X8 LF (GAUZE/BANDAGES/DRESSINGS) IMPLANT
GLOVE BIO SURGEON STRL SZ 6.5 (GLOVE) ×3 IMPLANT
GLOVE BIO SURGEON STRL SZ7 (GLOVE) ×3 IMPLANT
GLOVE BIO SURGEON STRL SZ7.5 (GLOVE) IMPLANT
GLOVE BIO SURGEON STRL SZ8 (GLOVE) ×3 IMPLANT
GLOVE BIOGEL PI IND STRL 7.0 (GLOVE) ×2 IMPLANT
GLOVE BIOGEL PI IND STRL 8 (GLOVE) ×2 IMPLANT
GLOVE BIOGEL PI INDICATOR 7.0 (GLOVE) ×1
GLOVE BIOGEL PI INDICATOR 8 (GLOVE) ×1
GLOVE ORTHO TXT STRL SZ7.5 (GLOVE) ×3 IMPLANT
GLOVE SURG ORTHO 8.0 STRL STRW (GLOVE) ×3 IMPLANT
GOWN PREVENTION PLUS XLARGE (GOWN DISPOSABLE) ×3 IMPLANT
GOWN PREVENTION PLUS XXLARGE (GOWN DISPOSABLE) ×3 IMPLANT
HANDPIECE INTERPULSE COAX TIP (DISPOSABLE) ×1
NS IRRIG 1000ML POUR BTL (IV SOLUTION) ×3 IMPLANT
PACK ARTHROSCOPY DSU (CUSTOM PROCEDURE TRAY) ×3 IMPLANT
PACK BASIN DAY SURGERY FS (CUSTOM PROCEDURE TRAY) ×3 IMPLANT
PAD CLEANER CAUTERY TIP 5X5 (MISCELLANEOUS)
REVERSE SHOULDER STEINMANN PIN ×3 IMPLANT
SAGITTAL BLADE EXTRA WIDE ×3 IMPLANT
SCREW BONE LOCKING 30MMX4.75MM (Screw) ×3 IMPLANT
SCREW BONE STERILE 6.5MMX25MM (Screw) ×3 IMPLANT
SCREW LOCKING 15MMX4.7MM (Screw) ×3 IMPLANT
SCREW LOCKING 4.7X25MM (Screw) ×3 IMPLANT
SET HNDPC FAN SPRY TIP SCT (DISPOSABLE) ×2 IMPLANT
SLEEVE SCD COMPRESS KNEE MED (MISCELLANEOUS) ×3 IMPLANT
SLING ARM FOAM STRAP LRG (SOFTGOODS) IMPLANT
SLING ARM FOAM STRAP MED (SOFTGOODS) IMPLANT
SLING ARM FOAM STRAP XLG (SOFTGOODS) IMPLANT
SLING ARM IMMOBILIZER LRG (SOFTGOODS) IMPLANT
SLING ARM IMMOBILIZER MED (SOFTGOODS) IMPLANT
SPONGE GAUZE 4X4 12PLY (GAUZE/BANDAGES/DRESSINGS) ×3 IMPLANT
SPONGE LAP 18X18 X RAY DECT (DISPOSABLE) ×3 IMPLANT
SPONGE LAP 4X18 X RAY DECT (DISPOSABLE) ×3 IMPLANT
STEINMANN PIN THREADED TIP ×3 IMPLANT
STRIP CLOSURE SKIN 1/2X4 (GAUZE/BANDAGES/DRESSINGS) ×3 IMPLANT
SUCTION FRAZIER TIP 10 FR DISP (SUCTIONS) ×3 IMPLANT
SUPPORT WRAP ARM LG (MISCELLANEOUS) ×3 IMPLANT
SUT FIBERWIRE #2 38 T-5 BLUE (SUTURE) ×6
SUT MNCRL AB 4-0 PS2 18 (SUTURE) IMPLANT
SUT VIC AB 0 CT1 18XCR BRD 8 (SUTURE) IMPLANT
SUT VIC AB 0 CT1 27 (SUTURE) ×1
SUT VIC AB 0 CT1 27XBRD ANBCTR (SUTURE) ×2 IMPLANT
SUT VIC AB 0 CT1 8-18 (SUTURE)
SUT VIC AB 2-0 SH 27 (SUTURE) ×1
SUT VIC AB 2-0 SH 27XBRD (SUTURE) ×2 IMPLANT
SUT VICRYL 3-0 CR8 SH (SUTURE) ×3 IMPLANT
SUTURE FIBERWR #2 38 T-5 BLUE (SUTURE) ×4 IMPLANT
TAPE STRIPS DRAPE STRL (GAUZE/BANDAGES/DRESSINGS) IMPLANT
TOWEL OR 17X24 6PK STRL BLUE (TOWEL DISPOSABLE) ×3 IMPLANT
WATER STERILE IRR 1000ML POUR (IV SOLUTION) ×3 IMPLANT
YANKAUER SUCT BULB TIP NO VENT (SUCTIONS) ×3 IMPLANT

## 2011-03-09 NOTE — Interval H&P Note (Signed)
History and Physical Interval Note:   03/09/2011   12:41 PM   Patricia [name]  has presented today for surgery, with the diagnosis of rotator cuff arthropathy.  The various methods of treatment have been discussed with the patient and family. After consideration of risks, benefits and other options for treatment, the patient has consented to  Procedure(s): TOTAL SHOULDER ARTHROPLASTY, REVERSE as a surgical intervention .  The patients' history has been reviewed, patient examined, no change in status, stable for surgery.  I have reviewed the patients' chart and labs.  Questions were answered to the patient's satisfaction.     Eulas Post  MD

## 2011-03-09 NOTE — Anesthesia Postprocedure Evaluation (Signed)
Anesthesia Post Note  Patient: Patricia [name]  Procedure(s) Performed:  REVERSE SHOULDER ARTHROPLASTY  Anesthesia type: General  Patient location: PACU  Post pain: Pain level controlled  Post assessment: Patient's Cardiovascular Status Stable  Last Vitals:  Filed Vitals:   03/09/11 1645  BP: 117/67  Pulse: 81  Temp:   Resp: 16    Post vital signs: Reviewed and stable  Level of consciousness: sedated  Complications: No apparent anesthesia complications

## 2011-03-09 NOTE — Transfer of Care (Signed)
Immediate Anesthesia Transfer of Care Note  Patient: Patricia [name]  Procedure(s) Performed:  REVERSE SHOULDER ARTHROPLASTY  Patient Location: PACU  Anesthesia Type: GA combined with regional for post-op pain  Level of Consciousness: awake, alert  and patient cooperative  Airway & Oxygen Therapy: Patient Spontanous Breathing and Patient connected to face mask oxygen  Post-op Assessment: Report given to PACU RN, Post -op Vital signs reviewed and stable and Patient moving all extremities  Post vital signs: Reviewed and stable  Complications: No apparent anesthesia complications

## 2011-03-09 NOTE — Progress Notes (Signed)
Assisted Dr. Hodierne with right, ultrasound guided, interscalene  block. Side rails up, monitors on throughout procedure. See vital signs in flow sheet. Tolerated Procedure well. 

## 2011-03-09 NOTE — Anesthesia Preprocedure Evaluation (Signed)
Anesthesia Evaluation  Patient identified by MRN, date of birth, ID band Patient awake    Reviewed: Allergy & Precautions, H&P , NPO status , Patient's Chart, lab work & pertinent test results  Airway Mallampati: II  Neck ROM: full    Dental   Pulmonary          Cardiovascular hypertension,     Neuro/Psych    GI/Hepatic   Endo/Other    Renal/GU      Musculoskeletal   Abdominal   Peds  Hematology   Anesthesia Other Findings   Reproductive/Obstetrics                           Anesthesia Physical Anesthesia Plan  ASA: II  Anesthesia Plan: General   Post-op Pain Management: MAC Combined w/ Regional for Post-op pain   Induction: Intravenous  Airway Management Planned: Oral ETT  Additional Equipment:   Intra-op Plan:   Post-operative Plan: Extubation in OR  Informed Consent: I have reviewed the patients History and Physical, chart, labs and discussed the procedure including the risks, benefits and alternatives for the proposed anesthesia with the patient or authorized representative who has indicated his/her understanding and acceptance.     Plan Discussed with: CRNA and Surgeon  Anesthesia Plan Comments:         Anesthesia Quick Evaluation

## 2011-03-09 NOTE — Op Note (Signed)
03/09/2011  3:37 PM  PATIENT:  Patricia [name]    PRE-OPERATIVE DIAGNOSIS:  Right shoulder rotator cuff arthropathy  POST-OPERATIVE DIAGNOSIS:  Same  PROCEDURE:  REVERSE SHOULDER ARTHROPLASTY  SURGEON:  Zafiro Routson P  PHYSICIAN ASSISTANT: Janace Litten, OPA-C, present and scrubbed throughout the case, critical for completion in a timely fashion, and for retraction, instrumentation, and closure.  ANESTHESIA:   General  PREOPERATIVE INDICATIONS:  Patricia [name] is a  76 y.o. female with a diagnosis of right shoulder rotator cuff arthropathy who failed conservative measures and elected for surgical management.  She had previous injections, as well as a home exercise program, and activity modification, and continued to have severe pain with pseudoparalysis of the right upper extremity.  The risks benefits and alternatives were discussed with the patient preoperatively including but not limited to the risks of infection, bleeding, nerve injury, cardiopulmonary complications, the need for revision surgery, among others, and the patient was willing to proceed. We also discussed the risks of dislocation, cosmetic deformity of the shoulder, axillary nerve palsy, among others.  OPERATIVE IMPLANTS: Biomet comprehensive reverse total shoulder arthroplasty system using a 25 mm glenoid baseplate, and a central 25 mm 6.5 mm screw with a total of 3 peripheral locking screws, and a size 10 mm humeral stem with a size 44 mm standard humeral tray and a 44 x 36 mm diameter humeral bearing. I used a 36 mm standard glenoid sphere.  OPERATIVE FINDINGS: Severe rotator cuff arthropathy with acetabular elevation of the undersurface of the acromion, and fatty atrophy and complete loss of the supraspinatus, with extremely poor quality subscapularis.  OPERATIVE PROCEDURE: The patient is brought to the operating room placed in supine position. Foley was placed. IV antibiotics in the form of Ancef was given. General  anesthesia administered. She also received a regional block. Time out was performed. She was in a beachchair position. The right upper extremity was prepped and draped in usual sterile fashion.  Deltopectoral approach was performed. Cephalic vein was identified and retracted laterally. The adhesions were mobilized. The biceps tendon was seen and he is to the pectoralis insertion. The subscapularis was released. The quality of this tissue was very poor, and was therefore not repaired at the end of the case.  The inferior glenohumeral ligaments were released, and the head was dislocated. Deep retractors were placed.  A reamer was introduced, and I sequentially reamed up to size 10. I assembled my proximal cutting jig, and resected my proximal humerus. This was placed slightly lower than for a standard total shoulder arthroplasty.  I then placed deep retractors and exposed the glenoid. Circumferential release of the labrum was performed, and the biceps tendon stump was excised. I have excellent access to the glenoid. A guidepin was placed into the central location. I then reamed over the guidepin, and I did place a slight inferior tilt on the guidepin in order to minimize risk for superior angulation of the implant. After reaming, I used a rongeur to remove the bone that was inferior.  I then placed a trial base plate, and it sat down very well, so I impacted the real faint. I a drilled the central screw, confirm the length which was 25 mm. The central compression screw was placed and had excellent fixation and compression of the implant down to bone. I then placed 3 peripheral locking screws.  After satisfactory fixation the baseplate I placed the real glenosphere into place. This was impacted and seated well. Morse taper was engaged.  I  then turned my attention back to the humeral side. I broached sequentially up to size 10, and then trialed this with the standard humeral tray. This was found to have  slightly more than 2 finger tension, but overall very acceptable stability as well as range of motion.  I then selected the above-named components, impacted the real prosthesis in place, and this seated completely down, so I placed the real humeral tray and reduced the shoulder. It had excellent stability throughout a functional range of motion. The subscapularis was not repaired due to the poor quality of the tissue.  The wounds were then irrigated copiously, and the deltopectoral interval closed with Vicryl followed by Vicryl for the subcutaneous tissue, and Monocryl with Steri-Strips and sterile gauze for the skin. The patient was placed in a sling, and awakened and returned to PACU in stable and satisfactory condition. There no complications and she tolerated the procedure well. She did have a Foley placed prior to the procedure, and estimated blood loss was about 150 cc.

## 2011-03-09 NOTE — Anesthesia Procedure Notes (Addendum)
Anesthesia Regional Block:  Interscalene brachial plexus block  Pre-Anesthetic Checklist: ,, timeout performed, Correct Patient, Correct Site, Correct Laterality, Correct Procedure, Correct Position, site marked, Risks and benefits discussed,  Surgical consent,  Pre-op evaluation,  At surgeon's request and post-op pain management  Laterality: Right  Prep: chloraprep       Needles:  Injection technique: Single-shot  Needle Type: Echogenic Stimulator Needle     Needle Length: 5cm 5 cm Needle Gauge: 22 and 22 G    Additional Needles:  Procedures: ultrasound guided and nerve stimulator Interscalene brachial plexus block  Nerve Stimulator or Paresthesia:  Response: biceps flexion, 0.45 mA,   Additional Responses:   Narrative:  Start time: 03/09/2011 12:55 PM End time: 03/09/2011 1:02 PM Injection made incrementally with aspirations every 5 mL.  Performed by: Personally  Anesthesiologist: Dr Chaney Malling  Additional Notes: Functioning IV was confirmed and monitors were applied.  A 50mm 22ga Arrow echogenic stimulator needle was used. Sterile prep and drape,hand hygiene and sterile gloves were used.  Negative aspiration and negative test dose prior to incremental administration of local anesthetic. The patient tolerated the procedure well.  Ultrasound guidance: relevent anatomy identified, needle position confirmed, local anesthetic spread visualized around nerve(s), vascular puncture avoided.  Image printed for medical record.   Interscalene brachial plexus block Procedure Name: Intubation Date/Time: 03/09/2011 1:16 PM Performed by: Evelina Dun Pre-anesthesia Checklist: Patient identified, Emergency Drugs available, Suction available, Patient being monitored and Timeout performed Patient Re-evaluated:Patient Re-evaluated prior to inductionOxygen Delivery Method: Circle System Utilized Preoxygenation: Pre-oxygenation with 100% oxygen Intubation Type: IV induction Ventilation:  Mask ventilation without difficulty Tube type: Oral Tube size: 7.0 mm Number of attempts: 1 Airway Equipment and Method: stylet and oral airway Placement Confirmation: ETT inserted through vocal cords under direct vision,  positive ETCO2 and breath sounds checked- equal and bilateral Secured at: 22 cm Tube secured with: Tape Dental Injury: Teeth and Oropharynx as per pre-operative assessment

## 2011-03-10 ENCOUNTER — Encounter (HOSPITAL_COMMUNITY): Payer: Self-pay

## 2011-03-10 DIAGNOSIS — D62 Acute posthemorrhagic anemia: Secondary | ICD-10-CM | POA: Diagnosis present

## 2011-03-10 HISTORY — DX: Acute posthemorrhagic anemia: D62

## 2011-03-10 LAB — CBC
HCT: 23.1 % — ABNORMAL LOW (ref 36.0–46.0)
Hemoglobin: 7.6 g/dL — ABNORMAL LOW (ref 12.0–15.0)
MCH: 30 pg (ref 26.0–34.0)
MCHC: 32.9 g/dL (ref 30.0–36.0)
MCV: 91.3 fL (ref 78.0–100.0)
RBC: 2.53 MIL/uL — ABNORMAL LOW (ref 3.87–5.11)

## 2011-03-10 LAB — CARDIAC PANEL(CRET KIN+CKTOT+MB+TROPI)
CK, MB: 5.2 ng/mL — ABNORMAL HIGH (ref 0.3–4.0)
Relative Index: 2.2 (ref 0.0–2.5)
Total CK: 236 U/L — ABNORMAL HIGH (ref 7–177)
Troponin I: <0.3 ng/mL

## 2011-03-10 LAB — PREPARE RBC (CROSSMATCH)

## 2011-03-10 MED ORDER — ACETAMINOPHEN 325 MG PO TABS
650.0000 mg | ORAL_TABLET | Freq: Once | ORAL | Status: AC
  Administered 2011-03-10: 650 mg via ORAL
  Filled 2011-03-10: qty 2

## 2011-03-10 MED ORDER — PROMETHAZINE HCL 25 MG PO TABS: 25.0000 mg | ORAL_TABLET | Freq: Four times a day (QID) | ORAL | Status: DC | PRN

## 2011-03-10 MED ORDER — PROMETHAZINE HCL 25 MG PO TABS
25.0000 mg | ORAL_TABLET | Freq: Four times a day (QID) | ORAL | Status: DC | PRN
  Administered 2011-03-10 – 2011-03-11 (×4): 25 mg via ORAL
  Filled 2011-03-10 (×4): qty 1

## 2011-03-10 MED ORDER — PROMETHAZINE HCL 25 MG/ML IJ SOLN: 12.5000 mg | Freq: Four times a day (QID) | INTRAMUSCULAR | Status: DC | PRN

## 2011-03-10 MED ORDER — DIPHENHYDRAMINE HCL 25 MG PO CAPS
25.0000 mg | ORAL_CAPSULE | Freq: Once | ORAL | Status: AC
  Administered 2011-03-10: 25 mg via ORAL
  Filled 2011-03-10: qty 1

## 2011-03-10 MED ORDER — PROMETHAZINE HCL 25 MG/ML IJ SOLN: 25.0000 mg | Freq: Four times a day (QID) | INTRAMUSCULAR | Status: DC | PRN

## 2011-03-10 MED ORDER — OXYCODONE-ACETAMINOPHEN 5-325 MG PO TABS
0.5000 | ORAL_TABLET | ORAL | Status: DC | PRN
  Administered 2011-03-10 (×2): 0.5 via ORAL
  Filled 2011-03-10 (×3): qty 1

## 2011-03-10 MED ORDER — ENOXAPARIN SODIUM 40 MG/0.4ML ~~LOC~~ SOLN
40.0000 mg | SUBCUTANEOUS | Status: DC
  Administered 2011-03-10 – 2011-03-11 (×2): 40 mg via SUBCUTANEOUS
  Filled 2011-03-10 (×3): qty 0.4

## 2011-03-10 NOTE — Progress Notes (Addendum)
Subjective: 1 Day Post-Op Procedure(s) (LRB): REVERSE SHOULDER ARTHROPLASTY (Right) Patient reports pain as 3 on 0-10 scale.  Had episode of hypotension with SBP 58/34 with symptoms of dizziness, and was [facility] to tele[facility].  Denies chest pain, reported pain at left flank, where the strap from the OR table went around her side.  Now not having symptoms.  Had previous anemia, chronically 9 without symptoms, was seeing a nephrologist and getting IV iron.    Objective: Vital signs in last 24 hours: Temp:  [97.4 F (36.3 C)-98.7 F (37.1 C)] 98.5 F (36.9 C) (11/21 0523) Pulse Rate:  [65-94] 94  (11/21 0523) Resp:  [8-22] 18  (11/21 0523) BP: (58-152)/(34-80) 117/66 mmHg (11/21 0523) SpO2:  [93 %-100 %] 94 % (11/21 0523) Weight:  [59 kg (130 lb 1.1 oz)] 130 lb 1.1 oz (59 kg) (11/21 0150)  Intake/Output from previous day: 11/20 0701 - 11/21 0700 In: 3511.5 [P.O.:1239; I.V.:1672.5; IV Piggyback:600] Out: 1900 [Urine:1750; Blood:150] Intake/Output this shift:     Basename 03/09/11 1611  HGB 8.5*   Lab was unable to draw new labs this am.  Intact pulses distally Incision: dressing C/D/I All fingers f/e/abduct, but still some numbness and the block appears to still be working.  Assessment/Plan: 1 Day Post-Op Procedure(s) (LRB): REVERSE SHOULDER ARTHROPLASTY (Right) with post op hypotension. Advance diet Up with therapy Plan for discharge tomorrow Will recheck labs including cardiac enzymes, monitor h/h, telemetry, and if ok in am dc home tomorrow.   Ladarrion Telfair P 03/10/2011, 8:52 AM  Blood work reviewed, has ABLA.  Troponin negative.  Will give 2 u prbcs, discussed with patient. CBC    Component Value Date/Time   WBC 12.7* 03/10/2011 1038   RBC 2.53* 03/10/2011 1038   HGB 7.6* 03/10/2011 1038   HCT 23.1* 03/10/2011 1038   PLT 212 03/10/2011 1038   MCV 91.3 03/10/2011 1038   MCH 30.0 03/10/2011 1038   MCHC 32.9 03/10/2011 1038   RDW 15.1  03/10/2011 1038

## 2011-03-11 DIAGNOSIS — I9589 Other hypotension: Secondary | ICD-10-CM

## 2011-03-11 DIAGNOSIS — R58 Hemorrhage, not elsewhere classified: Secondary | ICD-10-CM

## 2011-03-11 LAB — TYPE AND SCREEN
Antibody Screen: NEGATIVE
Unit division: 0

## 2011-03-11 LAB — CBC
HCT: 28.4 % — ABNORMAL LOW (ref 36.0–46.0)
Hemoglobin: 9.7 g/dL — ABNORMAL LOW (ref 12.0–15.0)
RBC: 3.2 MIL/uL — ABNORMAL LOW (ref 3.87–5.11)
WBC: 8.5 10*3/uL (ref 4.0–10.5)

## 2011-03-11 NOTE — Progress Notes (Signed)
Subjective: 2 Days Post-Op Procedure(s) (LRB): REVERSE SHOULDER ARTHROPLASTY (Right)  Doing well, ready to go home Pain controlled Denies numbness or tingling R arm No cp, no sob No lightheadedness or dizziness  Objective: Current Vitals Blood pressure 126/69, pulse 89, temperature 97.7 F (36.5 C), temperature source Oral, resp. rate 18, height 5\' 1"  (1.549 m), weight 59 kg (130 lb 1.1 oz), SpO2 91.00%. Vital signs in last 24 hours: Temp:  [97.1 F (36.2 C)-98.6 F (37 C)] 97.7 F (36.5 C) (11/22 0636) Pulse Rate:  [87-99] 89  (11/22 0636) Resp:  [16-18] 18  (11/22 0636) BP: (101-126)/(55-74) 126/69 mmHg (11/22 0636) SpO2:  [91 %-95 %] 91 % (11/22 0636)  Intake/Output from previous day: 11/21 0701 - 11/22 0700 In: 2130 [P.O.:580; I.V.:500; Blood:1050] Out: 1500 [Urine:1500]  LABS  Basename 03/11/11 0600 03/10/11 1038 03/09/11 1611  HGB 9.7* 7.6* 8.5*    Basename 03/11/11 0600 03/10/11 1038  WBC 8.5 12.7*  RBC 3.20* 2.53*  HCT 28.4* 23.1*  PLT 191 212   No results found for this basename: NA:2,K:2,CL:2,CO2:2,BUN:2,CREATININE:2,GLUCOSE:2,CALCIUM:2 in the last 72 hours No results found for this basename: LABPT:2,INR:2 in the last 72 hours   Physical Exam  Gen: sitting on EOB, awake, alert, NAD Lungs: clear Cardiac: S1 and S2 Abd: + BS Ext: R UEx  Incision is pristine  No drainage  R/U/M/Ax motor and sensory grossly intact  Swelling well controlled R UEx  + Radial pulse   Imaging No results found.  Assessment/Plan: 2 Days Post-Op Procedure(s) (LRB): REVERSE SHOULDER ARTHROPLASTY (Right)  76 y/o female s/p R reverse TSA, admitted secondary to hypotension  1. R reverse shoulder  Continue with sling  Digit motion for swelling control  dsg changes as needed  F/u with Dr. Dion Saucier in 2 weeks 2. ABL anemia with hypotension  Improved 3. FEN  Reg diet 4. Pain  PO meds 5. Dispo   D/C home today   Mearl Latin, PA-C 03/11/2011, 9:34 AM

## 2011-03-11 NOTE — Progress Notes (Signed)
   CARE MANAGEMENT NOTE 03/11/2011  Patient:  Patricia [name], Patricia [name]   Account Number:  [id number]  Date Initiated:  03/11/2011  Documentation initiated by:  SIMMONS,Dre Gamino  Subjective/Objective Assessment:   received referral for HHRN/PT/OT/aide.     Action/Plan:   referral made to [facility] by RN- Ander Purpura   Anticipated DC Date:  03/11/2011   Anticipated DC Plan:  HOME W HOME HEALTH SERVICES  In-house referral  NA      DC Planning Services  CM consult      [facility] Choice  HOME HEALTH   Choice offered to / List presented to:  C-1 Patient   DME arranged  NA      DME agency  NA     HH arranged  HH-1 RN  HH-2 PT  HH-3 OT  HH-4 NURSE'S AIDE      HH agency  [facility]   Status of service:  Completed, signed off Medicare Important Message given?   (If response is "NO", the following Medicare IM given date fields will be blank) Date Medicare IM given:   Date Additional Medicare IM given:    Discharge Disposition:  HOME W HOME HEALTH SERVICES  Per UR Regulation:    Comments:

## 2011-03-11 NOTE — Discharge Summary (Signed)
Physician Discharge Summary  Patient ID: Patricia [name] MRN: [id number] DOB/AGE: 23-Nov-1934 76 y.o.  Admit date: 03/09/2011 Discharge date: 03/11/2011  Admission Diagnoses: Right shoulder rotator cuff arthropathy Postoperative anemia due to acute blood loss  Discharge Diagnoses:  Principal Problem:  *Postoperative anemia due to acute blood loss Active Problems:  Rotator cuff tear, right  Degenerative arthritis of right shoulder region   Past Medical History  Diagnosis Date  . Arthritis   . Hypertension   . Glaucoma   . Macular degeneration disease   . Spinal stenosis   . Complication of anesthesia     nausea from fentayl  . Spinal stenosis   . Rotator cuff tear, right 03/06/2011  . Degenerative arthritis of right shoulder region 03/06/2011    Surgeries: Procedure(s): REVERSE SHOULDER ARTHROPLASTY on 03/09/2011   Consultants (if any):    Discharged Condition: Improved  Hospital Course: Patricia [name] is an 76 y.o. female who was admitted 03/09/2011 with a diagnosis of right rotator cuff arthropathy and went to the operating room on 03/09/2011 and underwent the above named procedures.  [facility] and then had an episode of hypotension SBP 53 and was transferred to telemetry.  She was monitored and remained stable thereafter.  She had cardiac enzymes that were negative.  She had acute on chronic anemia, and was transfused 2 units pRBCs and improved.  Pain was controlled.   She was given perioperative antibiotics:  Anti-infectives     Start     Dose/Rate Route Frequency Ordered Stop   03/09/11 1615   ceFAZolin (ANCEF) IVPB 1 g/50 mL premix        1 g 100 mL/hr over 30 Minutes Intravenous Every 6 hours 03/09/11 1609 03/10/11 1014   03/09/11 1030   ceFAZolin (ANCEF) IVPB 1 g/50 mL premix        1 g 100 mL/hr over 30 Minutes Intravenous 60 min pre-op 03/09/11 1023 03/09/11 1310        .  She was given sequential compression devices, early ambulation, and  lovenox for DVT prophylaxis.  They benefited maximally from their hospital stay and there were no complications.    Recent vital signs:  Filed Vitals:   03/11/11 0636  BP: 126/69  Pulse: 89  Temp: 97.7 F (36.5 C)  Resp: 18    Recent laboratory studies:  Lab Results  Component Value Date   HGB 9.7* 03/11/2011   HGB 7.6* 03/10/2011   HGB 8.5* 03/09/2011   Lab Results  Component Value Date   WBC 8.5 03/11/2011   PLT 191 03/11/2011   Lab Results  Component Value Date   INR 1.02 03/05/2011   Lab Results  Component Value Date   NA 135 03/05/2011   K 4.2 03/05/2011   CL 100 03/05/2011   CO2 27 03/05/2011   BUN 18 03/05/2011   CREATININE 0.79 03/05/2011   GLUCOSE 77 03/05/2011   CBC    Component Value Date/Time   WBC 8.5 03/11/2011 0600   RBC 3.20* 03/11/2011 0600   HGB 9.7* 03/11/2011 0600   HCT 28.4* 03/11/2011 0600   PLT 191 03/11/2011 0600   MCV 88.8 03/11/2011 0600   MCH 30.3 03/11/2011 0600   MCHC 34.2 03/11/2011 0600   RDW 15.9* 03/11/2011 0600     Discharge Medications:   Current Discharge Medication List    START taking these medications   Details  methocarbamol (ROBAXIN) 500 MG tablet Take 1 tablet (500 mg total) by mouth 4 (  four) times daily. Qty: 75 tablet, Refills: 1    !! promethazine (PHENERGAN) 25 MG tablet Take 1 tablet (25 mg total) by mouth every 6 (six) hours as needed for nausea. Qty: 30 tablet, Refills: 0     !! - Potential duplicate medications found. Please discuss with provider.    CONTINUE these medications which have CHANGED   Details  oxyCODONE-acetaminophen (ROXICET) 5-325 MG per tablet Take 1-2 tablets by mouth every 6 (six) hours as needed for pain. Qty: 50 tablet, Refills: 0      CONTINUE these medications which have NOT CHANGED   Details  acetaminophen (TYLENOL) 500 MG tablet Take 1,000 mg by mouth every 6 (six) hours as needed.      aspirin EC 81 MG tablet Take 81 mg by mouth daily.      atenolol (TENORMIN) 25  MG tablet Take 25 mg by mouth daily.      Coenzyme Q10 (COQ10 PO) Take 1 tablet by mouth daily.      diclofenac sodium (VOLTAREN) 1 % GEL Apply 1 application topically 4 (four) times daily.     fexofenadine (ALLEGRA) 60 MG tablet Take 60 mg by mouth 2 (two) times daily. For allergies     fish oil-omega-3 fatty acids 1000 MG capsule Take 1 g by mouth daily.      GLUCOSAMINE PO Take 1 tablet by mouth 2 (two) times daily. With MSM Hold while in hospital     hydroxypropyl methylcellulose (ISOPTO TEARS) 2.5 % ophthalmic solution Place 1 drop into both eyes daily as needed.      levothyroxine (SYNTHROID, LEVOTHROID) 112 MCG tablet Take 112 mcg by mouth daily. Name brand Synthroid only     lidocaine (LIDODERM) 5 % Place 1 patch onto the skin daily. Remove & Discard patch within 12 hours or as directed by MD     lisinopril (PRINIVIL,ZESTRIL) 10 MG tablet Take 10 mg by mouth daily.      Multiple Vitamins-Minerals (MULTIVITAMINS THER. W/MINERALS) TABS Take 1 tablet by mouth daily.      Multiple Vitamins-Minerals (PRESERVISION AREDS) CAPS Take 1 capsule by mouth 2 (two) times daily.      OVER THE COUNTER MEDICATION Take 1 capsule by mouth daily. Glucosamine with MSM    pantoprazole (PROTONIX) 40 MG tablet Take 40 mg by mouth daily.      !! promethazine (PHENERGAN) 25 MG tablet Take 25 mg by mouth every 6 (six) hours as needed. For nausea associated with Percocet and Vicodin     sodium chloride (OCEAN) 0.65 % SOLN nasal spray Place 2 sprays into the nose as needed. To treat dry nasal passages and nose bleeds      !! - Potential duplicate medications found. Please discuss with provider.    STOP taking these medications     cyclobenzaprine (FLEXERIL) 10 MG tablet      HYDROcodone-acetaminophen (NORCO) 5-325 MG per tablet         Diagnostic Studies: Dg Chest 2 View  03/05/2011  *RADIOLOGY REPORT*  Clinical Data: Preoperative respiratory films.  CHEST - 2 VIEW  Comparison: PA and  lateral chest 06/11/2008.  Findings: The lungs are clear.  Heart size is normal.  No pneumothorax or pleural effusion.  Postoperative change left shoulder noted.  IMPRESSION: No acute disease.  Original Report Authenticated By: Bernadene Bell. D'ALESSIO, M.D.   Dg Shoulder Right  02/23/2011  *RADIOLOGY REPORT*  Clinical Data: Pain  RIGHT SHOULDER - 2+ VIEW  Comparison: None.  Findings: No acute  fracture and no dislocation.  Prominent degenerative change of the [facility] joint.  Chronic rotator cuff tearing. Osteopenia.  IMPRESSION: No acute bony pathology.  Chronic change.  Original Report Authenticated By: Donavan Burnet, M.D.    Disposition: Home or Self Care  Discharge Orders    Future Orders Please Complete By Expires   Diet general      Call MD / Call 911      Comments:   If you experience chest pain or shortness of breath, CALL 911 and be transported to the hospital emergency room.  If you develope a fever above 101 F, pus (white drainage) or increased drainage or redness at the wound, or calf pain, call your surgeon's office.   Constipation Prevention      Comments:   Drink plenty of fluids.  Prune juice may be helpful.  You may use a stool softener, such as Colace (over the counter) 100 mg twice a day.  Use MiraLax (over the counter) for constipation as needed.   Increase activity slowly as tolerated      Discharge wound care:      Comments:   If you have a hip bandage, keep it clean and dry.  Change your bandage as instructed by your health care providers.  If your bandage has been discontinued, keep your incision clean and dry.  Pat dry after bathing.  DO NOT put lotion or powder on your incision.   Lifting restrictions      Comments:   No use of right arm.  Stay in sling.      Follow-up Information    Follow up with Wisam Siefring P in 2 weeks.   Contact information:   [facility] [address] [place] [address] [phone]            Signed: Eulas Post 03/11/2011, 7:59 AM

## 2011-03-11 NOTE — Progress Notes (Signed)
Pt discharged home with daughter and active [facility] referral.  All dc instructions and prescriptions given, reviewed, and understood.

## 2011-03-11 NOTE — Progress Notes (Signed)
Dsg change to Rt shoulder per order- Allevyn dressing applied, site with steri-strips in place no drainage  Georgette Dover 03/11/2011

## 2011-03-26 ENCOUNTER — Encounter (HOSPITAL_BASED_OUTPATIENT_CLINIC_OR_DEPARTMENT_OTHER): Payer: Self-pay | Admitting: Orthopedic Surgery

## 2011-04-05 ENCOUNTER — Telehealth: Payer: Self-pay | Admitting: Oncology

## 2011-04-05 NOTE — Telephone Encounter (Signed)
lmonvm adviisng the pt of her new pt appt in jan and for her to call me back to confirm

## 2011-04-21 DIAGNOSIS — M25619 Stiffness of unspecified shoulder, not elsewhere classified: Secondary | ICD-10-CM | POA: Diagnosis not present

## 2011-04-21 DIAGNOSIS — M25519 Pain in unspecified shoulder: Secondary | ICD-10-CM | POA: Diagnosis not present

## 2011-04-21 DIAGNOSIS — R29898 Other symptoms and signs involving the musculoskeletal system: Secondary | ICD-10-CM | POA: Diagnosis not present

## 2011-04-22 DIAGNOSIS — M25619 Stiffness of unspecified shoulder, not elsewhere classified: Secondary | ICD-10-CM | POA: Diagnosis not present

## 2011-04-22 DIAGNOSIS — M25519 Pain in unspecified shoulder: Secondary | ICD-10-CM | POA: Diagnosis not present

## 2011-04-22 DIAGNOSIS — R29898 Other symptoms and signs involving the musculoskeletal system: Secondary | ICD-10-CM | POA: Diagnosis not present

## 2011-04-23 ENCOUNTER — Other Ambulatory Visit: Payer: Self-pay | Admitting: *Deleted

## 2011-04-23 DIAGNOSIS — D649 Anemia, unspecified: Secondary | ICD-10-CM

## 2011-04-26 ENCOUNTER — Ambulatory Visit: Payer: 59

## 2011-04-26 ENCOUNTER — Ambulatory Visit (HOSPITAL_BASED_OUTPATIENT_CLINIC_OR_DEPARTMENT_OTHER): Payer: 59 | Admitting: Oncology

## 2011-04-26 ENCOUNTER — Other Ambulatory Visit (HOSPITAL_BASED_OUTPATIENT_CLINIC_OR_DEPARTMENT_OTHER): Payer: 59 | Admitting: Lab

## 2011-04-26 ENCOUNTER — Other Ambulatory Visit (HOSPITAL_BASED_OUTPATIENT_CLINIC_OR_DEPARTMENT_OTHER): Payer: 59 | Admitting: Oncology

## 2011-04-26 VITALS — BP 126/75 | HR 76 | Temp 97.0°F | Ht 61.0 in | Wt 135.6 lb

## 2011-04-26 DIAGNOSIS — R799 Abnormal finding of blood chemistry, unspecified: Secondary | ICD-10-CM

## 2011-04-26 DIAGNOSIS — D649 Anemia, unspecified: Secondary | ICD-10-CM

## 2011-04-26 DIAGNOSIS — R319 Hematuria, unspecified: Secondary | ICD-10-CM

## 2011-04-26 DIAGNOSIS — Z9889 Other specified postprocedural states: Secondary | ICD-10-CM

## 2011-04-26 LAB — COMPREHENSIVE METABOLIC PANEL
Albumin: 4.2 g/dL (ref 3.5–5.2)
CO2: 29 mEq/L (ref 19–32)
Glucose, Bld: 107 mg/dL — ABNORMAL HIGH (ref 70–99)
Sodium: 138 mEq/L (ref 135–145)
Total Bilirubin: 0.4 mg/dL (ref 0.3–1.2)
Total Protein: 6.3 g/dL (ref 6.0–8.3)

## 2011-04-26 LAB — URINALYSIS, MICROSCOPIC - CHCC
Bilirubin (Urine): NEGATIVE
Glucose: NEGATIVE g/dL
Leukocyte Esterase: NEGATIVE

## 2011-04-26 LAB — CBC WITH DIFFERENTIAL/PLATELET
Basophils Absolute: 0 10*3/uL (ref 0.0–0.1)
Eosinophils Absolute: 0.2 10*3/uL (ref 0.0–0.5)
HCT: 31.2 % — ABNORMAL LOW (ref 34.8–46.6)
HGB: 10.6 g/dL — ABNORMAL LOW (ref 11.6–15.9)
MCV: 92.4 fL (ref 79.5–101.0)
MONO%: 9.6 % (ref 0.0–14.0)
NEUT#: 3.3 10*3/uL (ref 1.5–6.5)
NEUT%: 57.5 % (ref 38.4–76.8)
Platelets: 277 10*3/uL (ref 145–400)
RDW: 15 % — ABNORMAL HIGH (ref 11.2–14.5)

## 2011-04-26 NOTE — Progress Notes (Signed)
Referring MD: Valrie Hart 77 y.o.  1934/11/12    Reason for Referral: Patricia [name] is a 77 year old referred for evaluation of anemia.     HPI: She reports a near lifelong history of "anemia ". She is followed by Dr. Tiburcio Pea for primary care. She reports being diagnosed with a "kidney abnormality "in the fall of 2010 and she was referred to a nephrologist in [place]. She reports the hemoglobin was low and responded to iron therapy. She underwent a reverse arthroplasty by Dr.Landau in November of 2012. On hospital admission 03/05/2011 the chemotherapy returned at 10.8 with an MCV of 91.8. The hemoglobin fell to 7.6 while in the hospital and she was transfused with packed red blood cells. On 03/11/2011 hemoglobin returned at 9.7.  She denies bleeding aside from very occasional "hemorrhoid "bleeding. She had a colonoscopy approximately 8-10 years ago.  Past Medical History  Diagnosis Date  . Arthritis-negative evaluation for rheumatoid arthritis by Dr. Nickola Major    . Hypertension   . Glaucoma   . Macular degeneration disease   . Spinal stenosis   . Complication of anesthesia     nausea from fentayl  . Spinal stenosis   . Rotator cuff tear, right 03/06/2011  . Degenerative arthritis of right shoulder region 03/06/2011   . G5, P4, 1 miscarriage  Past Surgical History  Procedure Date  . Rotator cuff repair, left  2010  . Abdominal hysterectomy       . Carpal tunnel release   . Eye surgery     cataract-lt  . Joint replacement     lt knee-05  . Reverse shoulder arthroplasty 03/09/2011    Procedure: REVERSE SHOULDER ARTHROPLASTY;  Surgeon: Eulas Post;  Location: [facility];  Service: Orthopedics;  Laterality: Right;    Family history: Her mother died of a GYN malignancy. A maternal aunt had bilateral breast cancer and colon cancer. No family history of anemia.   Current outpatient prescriptions:acetaminophen (TYLENOL) 500 MG tablet, Take 1,000 mg  by mouth every 6 (six) hours as needed.  , Disp: , Rfl: ;  aspirin EC 81 MG tablet, Take 81 mg by mouth daily.  , Disp: , Rfl: ;  atenolol (TENORMIN) 25 MG tablet, Take 25 mg by mouth daily.  , Disp: , Rfl: ;  Coenzyme Q10 (COQ10 PO), Take 1 tablet by mouth daily.  , Disp: , Rfl:  cyclobenzaprine (FLEXERIL) 10 MG tablet, Take 10 mg by mouth at bedtime as needed. Takes 10-20mg  , Disp: , Rfl: ;  diclofenac sodium (VOLTAREN) 1 % GEL, Apply 1 application topically 4 (four) times daily. , Disp: , Rfl: ;  fexofenadine (ALLEGRA) 60 MG tablet, Take 60 mg by mouth 2 (two) times daily. For allergies , Disp: , Rfl: ;  fish oil-omega-3 fatty acids 1000 MG capsule, Take 1,400 mg by mouth daily. , Disp: , Rfl:  GLUCOSAMINE PO, Take 1,500 mg by mouth daily. With MSM Hold while in hospital, Disp: , Rfl: ;  hydroxypropyl methylcellulose (ISOPTO TEARS) 2.5 % ophthalmic solution, Place 1 drop into both eyes daily as needed.  , Disp: , Rfl: ;  levothyroxine (SYNTHROID, LEVOTHROID) 100 MCG tablet, Take 100 mcg by mouth daily.  , Disp: , Rfl: ;  lisinopril (PRINIVIL,ZESTRIL) 10 MG tablet, Take 10 mg by mouth daily.  , Disp: , Rfl:  Lysine 1000 MG TABS, Take 1,000 mg by mouth daily.  , Disp: , Rfl: ;  MEFLOQUINE HCL PO, Take by mouth as  needed.  , Disp: , Rfl: ;  Multiple Vitamins-Minerals (MULTIVITAMINS THER. W/MINERALS) TABS, Take 1 tablet by mouth daily.  , Disp: , Rfl: ;  Multiple Vitamins-Minerals (PRESERVISION AREDS) CAPS, Take 1 capsule by mouth 2 (two) times daily.  , Disp: , Rfl: ;  pantoprazole (PROTONIX) 40 MG tablet, Take 40 mg by mouth daily.  , Disp: , Rfl:  promethazine (PHENERGAN) 25 MG tablet, Take 25 mg by mouth every 6 (six) hours as needed. For nausea associated with Percocet and Vicodin , Disp: , Rfl: ;  VITAMIN D, CHOLECALCIFEROL, PO, Take 400 Units by mouth daily.  , Disp: , Rfl: ;  zolpidem (AMBIEN) 5 MG tablet, Take 5 mg by mouth at bedtime as needed.  , Disp: , Rfl:  lidocaine (LIDODERM) 5 %, Place 1 patch  onto the skin daily. Remove & Discard patch within 12 hours or as directed by MD , Disp: , Rfl: ;  sodium chloride (OCEAN) 0.65 % SOLN nasal spray, Place 2 sprays into the nose as needed. To treat dry nasal passages and nose bleeds , Disp: , Rfl:   Allergies:  Allergies  Allergen Reactions  . Codeine Nausea And Vomiting  . Fentanyl And Related Nausea And Vomiting  . Meclizine Nausea And Vomiting  . Tramadol Nausea And Vomiting  . 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 She does not use tobacco. She was transfused with the shoulder surgery in November of 2012. She has no other transfusion history. She denies risk factors for HIV and hepatitis. She lives with her daughter in [place] at present. She plans to return to [place] when she has recovered from the shoulder surgery.    History  Alcohol Use  . Yes    occasional    History  Smoking status  . Never Smoker           ROS:   Positives include: "Arthritis "at the shoulders, back, and knees. Chronic nose bleeding when she has a cold.  A complete ROS was otherwise negative.  Physical Exam:  Blood pressure 126/75, pulse 76, temperature 97 F (36.1 C), temperature source Oral, height 5\' 1"  (1.549 m), weight 135 lb 9.6 oz (61.508 kg).  HEENT: Oropharynx without mass. No thrush or ulcers. Neck without mass. Lungs: Clear bilaterally Cardiac: Regular rate and rhythm Abdomen: No hepatosplenomegaly. No mass. Nontender.  Vascular:  No leg edema Lymph nodes:  no cervical, supraclavicular, or inguinal lymph nodes. 1/2 cm soft mobile bilateral axillary nodes versus prominent fat pads.  Neurologic:  alert and oriented. The motor examination appears grossly intact. Skin:  no rash  Breast: Bilateral breast without mass   LAB:  CBC  Lab Results  Component Value Date   WBC 5.8 04/26/2011   HGB 10.6*  04/26/2011   HCT 31.2* 04/26/2011   MCV 92.4 04/26/2011   PLT 277 04/26/2011    Blood smear: The platelets appear normal in number. The white cell morphology is unremarkable. There are rare teardrops, a valve sites, and helmet cells. The polychromasia is mildly increased in a few areas of the smear. The red cell morphology is generally unremarkable.   Urinalysis: Moderate "bled ", 3-6 red cells, 02 white cells, trace bacteria, negative for protein, negative for bilirubin  CMP      Component Value Date/Time   NA 138 04/26/2011 1346   K 4.6 04/26/2011 1346  CL 103 04/26/2011 1346   CO2 29 04/26/2011 1346   GLUCOSE 107* 04/26/2011 1346   BUN 29* 04/26/2011 1346   CREATININE 1.21* 04/26/2011 1346   CALCIUM 9.5 04/26/2011 1346   PROT 6.3 04/26/2011 1346   ALBUMIN 4.2 04/26/2011 1346   AST 24 04/26/2011 1346   ALT 15 04/26/2011 1346   ALKPHOS 113 04/26/2011 1346   BILITOT 0.4 04/26/2011 1346   GFRNONAA 80* 03/05/2011 0900   GFRAA >90 03/05/2011 0900     Assessment/Plan:    1. Normocytic anemia   2. Mild elevation of the serum creatinine   3. Microscopic hematuria  4. Right shoulder surgery November 2012   Disposition:    She is referred for evaluation of anemia. She reports a chronic history of anemia, but I do not have remote CBC data available today.  The hematologic indices and review of the peripheral blood smear are not suggestive of iron deficiency. The differential diagnosis includes anemia secondary to chronic renal insufficiency, myelodysplasia, and unrecognized bleeding. She has microscopic hematuria today and this was also noted on the urinalysis prior to shoulder surgery.  We obtained a ferritin, vitamin B 12 level, haptoglobin, LDH, and reticulocyte count today. She will return for an office visit and CBC in one month. We will repeat a urinalysis when she returns next month and make a urology referral if there is persistent hematuria. I will followup on remote CBC data from Dr. Tiburcio Pea.  I have  a low clinical suspicion for a malignancy. We will consider a diagnostic bone marrow biopsy if she develops more severe anemia.   Patricia [name] 04/26/2011, 6:12 PM

## 2011-04-27 DIAGNOSIS — M25519 Pain in unspecified shoulder: Secondary | ICD-10-CM | POA: Diagnosis not present

## 2011-04-27 DIAGNOSIS — R29898 Other symptoms and signs involving the musculoskeletal system: Secondary | ICD-10-CM | POA: Diagnosis not present

## 2011-04-27 DIAGNOSIS — M25619 Stiffness of unspecified shoulder, not elsewhere classified: Secondary | ICD-10-CM | POA: Diagnosis not present

## 2011-04-27 LAB — HAPTOGLOBIN: Haptoglobin: 124 mg/dL (ref 30–200)

## 2011-04-27 LAB — IRON AND TIBC
%SAT: 25 % (ref 20–55)
Iron: 69 ug/dL (ref 42–145)
TIBC: 281 ug/dL (ref 250–470)

## 2011-04-28 NOTE — Progress Notes (Signed)
Utilization review completed. Lenea Bywater, RN, BSN. 04/28/11  

## 2011-04-29 DIAGNOSIS — R29898 Other symptoms and signs involving the musculoskeletal system: Secondary | ICD-10-CM | POA: Diagnosis not present

## 2011-04-29 DIAGNOSIS — M25519 Pain in unspecified shoulder: Secondary | ICD-10-CM | POA: Diagnosis not present

## 2011-04-29 DIAGNOSIS — M25619 Stiffness of unspecified shoulder, not elsewhere classified: Secondary | ICD-10-CM | POA: Diagnosis not present

## 2011-05-04 DIAGNOSIS — M25519 Pain in unspecified shoulder: Secondary | ICD-10-CM | POA: Diagnosis not present

## 2011-05-04 DIAGNOSIS — R29898 Other symptoms and signs involving the musculoskeletal system: Secondary | ICD-10-CM | POA: Diagnosis not present

## 2011-05-04 DIAGNOSIS — M25619 Stiffness of unspecified shoulder, not elsewhere classified: Secondary | ICD-10-CM | POA: Diagnosis not present

## 2011-05-06 DIAGNOSIS — M25519 Pain in unspecified shoulder: Secondary | ICD-10-CM | POA: Diagnosis not present

## 2011-05-06 DIAGNOSIS — M25619 Stiffness of unspecified shoulder, not elsewhere classified: Secondary | ICD-10-CM | POA: Diagnosis not present

## 2011-05-06 DIAGNOSIS — R29898 Other symptoms and signs involving the musculoskeletal system: Secondary | ICD-10-CM | POA: Diagnosis not present

## 2011-05-11 ENCOUNTER — Telehealth: Payer: Self-pay | Admitting: *Deleted

## 2011-05-11 NOTE — Telephone Encounter (Signed)
Patient requesting MD call her with results of further lab testing on her new patient visit labs.

## 2011-05-12 DIAGNOSIS — M25619 Stiffness of unspecified shoulder, not elsewhere classified: Secondary | ICD-10-CM | POA: Diagnosis not present

## 2011-05-12 DIAGNOSIS — R29898 Other symptoms and signs involving the musculoskeletal system: Secondary | ICD-10-CM | POA: Diagnosis not present

## 2011-05-12 DIAGNOSIS — M25519 Pain in unspecified shoulder: Secondary | ICD-10-CM | POA: Diagnosis not present

## 2011-05-13 DIAGNOSIS — D62 Acute posthemorrhagic anemia: Secondary | ICD-10-CM

## 2011-05-14 DIAGNOSIS — M25519 Pain in unspecified shoulder: Secondary | ICD-10-CM | POA: Diagnosis not present

## 2011-05-14 DIAGNOSIS — R29898 Other symptoms and signs involving the musculoskeletal system: Secondary | ICD-10-CM | POA: Diagnosis not present

## 2011-05-14 DIAGNOSIS — M25619 Stiffness of unspecified shoulder, not elsewhere classified: Secondary | ICD-10-CM | POA: Diagnosis not present

## 2011-05-18 DIAGNOSIS — M25619 Stiffness of unspecified shoulder, not elsewhere classified: Secondary | ICD-10-CM | POA: Diagnosis not present

## 2011-05-18 DIAGNOSIS — R29898 Other symptoms and signs involving the musculoskeletal system: Secondary | ICD-10-CM | POA: Diagnosis not present

## 2011-05-18 DIAGNOSIS — M25519 Pain in unspecified shoulder: Secondary | ICD-10-CM | POA: Diagnosis not present

## 2011-05-20 DIAGNOSIS — R29898 Other symptoms and signs involving the musculoskeletal system: Secondary | ICD-10-CM | POA: Diagnosis not present

## 2011-05-20 DIAGNOSIS — M25619 Stiffness of unspecified shoulder, not elsewhere classified: Secondary | ICD-10-CM | POA: Diagnosis not present

## 2011-05-20 DIAGNOSIS — M25519 Pain in unspecified shoulder: Secondary | ICD-10-CM | POA: Diagnosis not present

## 2011-05-23 NOTE — Telephone Encounter (Signed)
Iron studies, b12 normal, microscopic hematuria, mild elevation of the creatinine.  Anemia likely related to chronic renal insufficiency, or chronic disease  Repeat urinalysis next visit.  F/u as scheduled.  Please all her

## 2011-05-24 DIAGNOSIS — D2330 Other benign neoplasm of skin of unspecified part of face: Secondary | ICD-10-CM | POA: Diagnosis not present

## 2011-05-24 DIAGNOSIS — L570 Actinic keratosis: Secondary | ICD-10-CM | POA: Diagnosis not present

## 2011-05-25 ENCOUNTER — Telehealth: Payer: Self-pay | Admitting: *Deleted

## 2011-05-25 DIAGNOSIS — M25519 Pain in unspecified shoulder: Secondary | ICD-10-CM | POA: Diagnosis not present

## 2011-05-25 DIAGNOSIS — M25619 Stiffness of unspecified shoulder, not elsewhere classified: Secondary | ICD-10-CM | POA: Diagnosis not present

## 2011-05-25 DIAGNOSIS — R293 Abnormal posture: Secondary | ICD-10-CM | POA: Diagnosis not present

## 2011-05-25 DIAGNOSIS — R29898 Other symptoms and signs involving the musculoskeletal system: Secondary | ICD-10-CM | POA: Diagnosis not present

## 2011-05-25 DIAGNOSIS — M545 Low back pain, unspecified: Secondary | ICD-10-CM | POA: Diagnosis not present

## 2011-05-25 DIAGNOSIS — M25559 Pain in unspecified hip: Secondary | ICD-10-CM | POA: Diagnosis not present

## 2011-05-25 NOTE — Telephone Encounter (Signed)
Patient notified via VM of the following response from MD: Iron studies, B12 normal, microscopic hematuria, mild elevation of creatinine Anemia likely related to chronic renal insufficiency, or chronic disease Repeat urinalysis at next visit. Follow up as scheduled. Will mail copy of labs to home address.

## 2011-05-28 DIAGNOSIS — M25519 Pain in unspecified shoulder: Secondary | ICD-10-CM | POA: Diagnosis not present

## 2011-05-28 DIAGNOSIS — M545 Low back pain, unspecified: Secondary | ICD-10-CM | POA: Diagnosis not present

## 2011-05-28 DIAGNOSIS — M25619 Stiffness of unspecified shoulder, not elsewhere classified: Secondary | ICD-10-CM | POA: Diagnosis not present

## 2011-05-28 DIAGNOSIS — M25559 Pain in unspecified hip: Secondary | ICD-10-CM | POA: Diagnosis not present

## 2011-05-28 DIAGNOSIS — R293 Abnormal posture: Secondary | ICD-10-CM | POA: Diagnosis not present

## 2011-05-28 DIAGNOSIS — R29898 Other symptoms and signs involving the musculoskeletal system: Secondary | ICD-10-CM | POA: Diagnosis not present

## 2011-05-31 DIAGNOSIS — M25519 Pain in unspecified shoulder: Secondary | ICD-10-CM | POA: Diagnosis not present

## 2011-05-31 DIAGNOSIS — Z4789 Encounter for other orthopedic aftercare: Secondary | ICD-10-CM | POA: Diagnosis not present

## 2011-06-01 ENCOUNTER — Ambulatory Visit (HOSPITAL_BASED_OUTPATIENT_CLINIC_OR_DEPARTMENT_OTHER): Payer: 59 | Admitting: Oncology

## 2011-06-01 ENCOUNTER — Other Ambulatory Visit: Payer: 59 | Admitting: Lab

## 2011-06-01 VITALS — BP 122/74 | HR 67 | Temp 97.2°F | Ht 61.0 in | Wt 137.2 lb

## 2011-06-01 DIAGNOSIS — R748 Abnormal levels of other serum enzymes: Secondary | ICD-10-CM | POA: Diagnosis not present

## 2011-06-01 DIAGNOSIS — R3129 Other microscopic hematuria: Secondary | ICD-10-CM

## 2011-06-01 DIAGNOSIS — B351 Tinea unguium: Secondary | ICD-10-CM | POA: Diagnosis not present

## 2011-06-01 DIAGNOSIS — D649 Anemia, unspecified: Secondary | ICD-10-CM

## 2011-06-01 LAB — CBC WITH DIFFERENTIAL/PLATELET
Basophils Absolute: 0 10*3/uL (ref 0.0–0.1)
EOS%: 5.4 % (ref 0.0–7.0)
MCH: 30.6 pg (ref 25.1–34.0)
MCV: 91.5 fL (ref 79.5–101.0)
MONO%: 10.7 % (ref 0.0–14.0)
RBC: 3.89 10*6/uL (ref 3.70–5.45)
RDW: 13.9 % (ref 11.2–14.5)

## 2011-06-01 NOTE — Progress Notes (Signed)
OFFICE PROGRESS NOTE   INTERVAL HISTORY:   She returns as scheduled. She has no new complaint. She reports orthopedics has released her to return to appetite. She denies bleeding.  Objective:  Vital signs in last 24 hours:  Blood pressure 122/74, pulse 67, temperature 97.2 F (36.2 C), temperature source Oral, height 5\' 1"  (1.549 m), weight 137 lb 3.2 oz (62.234 kg).    Physical exam not performed today  Lab Results:  Lab Results  Component Value Date   WBC 4.5 06/01/2011   HGB 11.9 06/01/2011   HCT 35.6 06/01/2011   MCV 91.5 06/01/2011   PLT 236 06/01/2011   ANC 2.4  04/26/2011-ferritin 264, LDH 179, vitamin B12 499, reticulocyte count 71.6, BUN 29, creatinine 1.21, calcium 9.5, bilirubin 0.4, haptoglobin 124  Medications: I have reviewed the patient's current medications.  Assessment/Plan:  1. Normocytic anemia-she reports a chronic history of anemia. The hemoglobin is normal today. A laboratory evaluation for any in January of 2013 was unremarkable.  2. Mild elevation of the serum creatinine  3. Microscopic hematuria-we will repeat a urinalysis today  4. Right shoulder surgery November 2012  Disposition:  She was referred for evaluation of anemia. The hemoglobin is in the low normal range today. The mild anemia may be related to chronic renal insufficiency, myelodysplasia, or potentially GU blood loss. She plans to return to [place] within the next one month. We will obtain a repeat urinalysis and make a urology referral if the microscopic hematuria persists.  She will arrange for a CBC in 4 months and 8 months while in [place]. She will forward these results to myself and Dr. Tiburcio Pea. She will call to schedule a followup appointment when she returns in approximately 1 year. She will seek medical attention in [place] if she develops significant anemia.   Lucile Shutters, MD  06/01/2011  9:56 AM

## 2011-06-02 DIAGNOSIS — M25619 Stiffness of unspecified shoulder, not elsewhere classified: Secondary | ICD-10-CM | POA: Diagnosis not present

## 2011-06-02 DIAGNOSIS — R293 Abnormal posture: Secondary | ICD-10-CM | POA: Diagnosis not present

## 2011-06-02 DIAGNOSIS — M25519 Pain in unspecified shoulder: Secondary | ICD-10-CM | POA: Diagnosis not present

## 2011-06-02 DIAGNOSIS — M545 Low back pain, unspecified: Secondary | ICD-10-CM | POA: Diagnosis not present

## 2011-06-02 DIAGNOSIS — M25559 Pain in unspecified hip: Secondary | ICD-10-CM | POA: Diagnosis not present

## 2011-06-02 DIAGNOSIS — R29898 Other symptoms and signs involving the musculoskeletal system: Secondary | ICD-10-CM | POA: Diagnosis not present

## 2011-06-03 ENCOUNTER — Telehealth: Payer: Self-pay

## 2011-06-03 DIAGNOSIS — K5730 Diverticulosis of large intestine without perforation or abscess without bleeding: Secondary | ICD-10-CM | POA: Diagnosis not present

## 2011-06-03 DIAGNOSIS — Z1211 Encounter for screening for malignant neoplasm of colon: Secondary | ICD-10-CM | POA: Diagnosis not present

## 2011-06-03 NOTE — Progress Notes (Signed)
PT. GIVEN A PRESCRIPTION DATED 06-01-11 TO HAVE A CBC WITH WHITE CELL DIFFERENTIAL  AND CHEMISTRY PANEL (TO INCLUDE CREATINE)  IN 4 MONTHS AND 8 MONTHS.

## 2011-06-03 NOTE — Telephone Encounter (Signed)
LM FOR MS. Ryall THAT DR. SHERRILL WOULD LIKE FOR HER TO HAVE A REPEAT UA TO F/U ON HER MICROSCOPIC HEMATURIA WITH IN THE WEEK.  PT. CAN CALL THE [facility] SCHEDULERS TO SET UP.

## 2011-06-04 ENCOUNTER — Telehealth: Payer: Self-pay | Admitting: *Deleted

## 2011-06-04 DIAGNOSIS — M25619 Stiffness of unspecified shoulder, not elsewhere classified: Secondary | ICD-10-CM | POA: Diagnosis not present

## 2011-06-04 DIAGNOSIS — M545 Low back pain, unspecified: Secondary | ICD-10-CM | POA: Diagnosis not present

## 2011-06-04 DIAGNOSIS — M25519 Pain in unspecified shoulder: Secondary | ICD-10-CM | POA: Diagnosis not present

## 2011-06-04 DIAGNOSIS — R29898 Other symptoms and signs involving the musculoskeletal system: Secondary | ICD-10-CM | POA: Diagnosis not present

## 2011-06-04 DIAGNOSIS — R3129 Other microscopic hematuria: Secondary | ICD-10-CM

## 2011-06-04 DIAGNOSIS — R293 Abnormal posture: Secondary | ICD-10-CM | POA: Diagnosis not present

## 2011-06-04 DIAGNOSIS — M25559 Pain in unspecified hip: Secondary | ICD-10-CM | POA: Diagnosis not present

## 2011-06-04 NOTE — Telephone Encounter (Signed)
Patient called back and left VM--she is out of town and will be back on 2/26 and would like to have U/A repeated on 2/26. Called back and left her message that lab is scheduled for 2/26 at 4pm.

## 2011-06-15 ENCOUNTER — Other Ambulatory Visit: Payer: 59 | Admitting: Lab

## 2011-06-15 ENCOUNTER — Telehealth: Payer: Self-pay | Admitting: *Deleted

## 2011-06-15 DIAGNOSIS — M545 Low back pain, unspecified: Secondary | ICD-10-CM | POA: Diagnosis not present

## 2011-06-15 DIAGNOSIS — K219 Gastro-esophageal reflux disease without esophagitis: Secondary | ICD-10-CM | POA: Diagnosis not present

## 2011-06-15 DIAGNOSIS — N183 Chronic kidney disease, stage 3 unspecified: Secondary | ICD-10-CM | POA: Diagnosis not present

## 2011-06-15 DIAGNOSIS — M25519 Pain in unspecified shoulder: Secondary | ICD-10-CM | POA: Diagnosis not present

## 2011-06-15 DIAGNOSIS — G479 Sleep disorder, unspecified: Secondary | ICD-10-CM | POA: Diagnosis not present

## 2011-06-15 DIAGNOSIS — J309 Allergic rhinitis, unspecified: Secondary | ICD-10-CM | POA: Diagnosis not present

## 2011-06-15 DIAGNOSIS — Z23 Encounter for immunization: Secondary | ICD-10-CM | POA: Diagnosis not present

## 2011-06-15 DIAGNOSIS — E785 Hyperlipidemia, unspecified: Secondary | ICD-10-CM | POA: Diagnosis not present

## 2011-06-15 DIAGNOSIS — M25559 Pain in unspecified hip: Secondary | ICD-10-CM | POA: Diagnosis not present

## 2011-06-15 DIAGNOSIS — R29898 Other symptoms and signs involving the musculoskeletal system: Secondary | ICD-10-CM | POA: Diagnosis not present

## 2011-06-15 DIAGNOSIS — I10 Essential (primary) hypertension: Secondary | ICD-10-CM | POA: Diagnosis not present

## 2011-06-15 DIAGNOSIS — M25619 Stiffness of unspecified shoulder, not elsewhere classified: Secondary | ICD-10-CM | POA: Diagnosis not present

## 2011-06-15 DIAGNOSIS — R293 Abnormal posture: Secondary | ICD-10-CM | POA: Diagnosis not present

## 2011-06-15 DIAGNOSIS — E039 Hypothyroidism, unspecified: Secondary | ICD-10-CM | POA: Diagnosis not present

## 2011-06-15 DIAGNOSIS — R3129 Other microscopic hematuria: Secondary | ICD-10-CM | POA: Diagnosis not present

## 2011-06-15 NOTE — Telephone Encounter (Signed)
Left VM that she sees Dr. Tiburcio Pea today and will have her U/A collected there and results will be sent to Dr. Truett Perna. Daughter works for [employer] and will sure we get results.

## 2011-06-17 DIAGNOSIS — M25619 Stiffness of unspecified shoulder, not elsewhere classified: Secondary | ICD-10-CM | POA: Diagnosis not present

## 2011-06-17 DIAGNOSIS — R293 Abnormal posture: Secondary | ICD-10-CM | POA: Diagnosis not present

## 2011-06-17 DIAGNOSIS — M25519 Pain in unspecified shoulder: Secondary | ICD-10-CM | POA: Diagnosis not present

## 2011-06-17 DIAGNOSIS — M545 Low back pain, unspecified: Secondary | ICD-10-CM | POA: Diagnosis not present

## 2011-06-17 DIAGNOSIS — R29898 Other symptoms and signs involving the musculoskeletal system: Secondary | ICD-10-CM | POA: Diagnosis not present

## 2011-06-17 DIAGNOSIS — M25559 Pain in unspecified hip: Secondary | ICD-10-CM | POA: Diagnosis not present

## 2011-06-21 DIAGNOSIS — R29898 Other symptoms and signs involving the musculoskeletal system: Secondary | ICD-10-CM | POA: Diagnosis not present

## 2011-06-21 DIAGNOSIS — R293 Abnormal posture: Secondary | ICD-10-CM | POA: Diagnosis not present

## 2011-06-21 DIAGNOSIS — M25519 Pain in unspecified shoulder: Secondary | ICD-10-CM | POA: Diagnosis not present

## 2011-06-21 DIAGNOSIS — M25559 Pain in unspecified hip: Secondary | ICD-10-CM | POA: Diagnosis not present

## 2011-06-21 DIAGNOSIS — M545 Low back pain, unspecified: Secondary | ICD-10-CM | POA: Diagnosis not present

## 2011-06-21 DIAGNOSIS — Z96619 Presence of unspecified artificial shoulder joint: Secondary | ICD-10-CM | POA: Diagnosis not present

## 2011-06-21 DIAGNOSIS — M25619 Stiffness of unspecified shoulder, not elsewhere classified: Secondary | ICD-10-CM | POA: Diagnosis not present

## 2011-06-22 ENCOUNTER — Telehealth: Payer: Self-pay | Admitting: *Deleted

## 2011-06-22 NOTE — Telephone Encounter (Signed)
Left VM that repeat U/A still shows hematuria and Dr. Truett Perna feels she needs urology referral to assess. Asked for her to let us know if she has particular physician she would like to see or if [facility] is OK.

## 2011-06-23 ENCOUNTER — Telehealth: Payer: Self-pay | Admitting: *Deleted

## 2011-06-23 DIAGNOSIS — N281 Cyst of kidney, acquired: Secondary | ICD-10-CM | POA: Diagnosis not present

## 2011-06-23 DIAGNOSIS — R3129 Other microscopic hematuria: Secondary | ICD-10-CM | POA: Diagnosis not present

## 2011-06-23 NOTE — Telephone Encounter (Signed)
Patient left VM that she has been at [facility] all day, which is why she has not returned our call. Called back and left VM that she does not need to return call--we wanted to be sure the hematuria was being addressed.

## 2011-06-23 NOTE — Telephone Encounter (Signed)
Left VM for patient to call back to discuss urinalysis results and follow up.

## 2011-06-24 DIAGNOSIS — R29898 Other symptoms and signs involving the musculoskeletal system: Secondary | ICD-10-CM | POA: Diagnosis not present

## 2011-06-24 DIAGNOSIS — M25519 Pain in unspecified shoulder: Secondary | ICD-10-CM | POA: Diagnosis not present

## 2011-06-24 DIAGNOSIS — Z96619 Presence of unspecified artificial shoulder joint: Secondary | ICD-10-CM | POA: Diagnosis not present

## 2011-06-24 DIAGNOSIS — R293 Abnormal posture: Secondary | ICD-10-CM | POA: Diagnosis not present

## 2011-06-24 DIAGNOSIS — M545 Low back pain, unspecified: Secondary | ICD-10-CM | POA: Diagnosis not present

## 2011-06-24 DIAGNOSIS — M25619 Stiffness of unspecified shoulder, not elsewhere classified: Secondary | ICD-10-CM | POA: Diagnosis not present

## 2012-09-19 DIAGNOSIS — H26499 Other secondary cataract, unspecified eye: Secondary | ICD-10-CM | POA: Diagnosis not present

## 2012-09-19 DIAGNOSIS — H2510 Age-related nuclear cataract, unspecified eye: Secondary | ICD-10-CM | POA: Diagnosis not present

## 2012-09-19 DIAGNOSIS — H43819 Vitreous degeneration, unspecified eye: Secondary | ICD-10-CM | POA: Diagnosis not present

## 2012-09-19 DIAGNOSIS — H4010X0 Unspecified open-angle glaucoma, stage unspecified: Secondary | ICD-10-CM | POA: Diagnosis not present

## 2012-09-20 DIAGNOSIS — D1801 Hemangioma of skin and subcutaneous tissue: Secondary | ICD-10-CM | POA: Diagnosis not present

## 2012-09-20 DIAGNOSIS — D485 Neoplasm of uncertain behavior of skin: Secondary | ICD-10-CM | POA: Diagnosis not present

## 2012-09-20 DIAGNOSIS — L570 Actinic keratosis: Secondary | ICD-10-CM | POA: Diagnosis not present

## 2012-09-20 DIAGNOSIS — L819 Disorder of pigmentation, unspecified: Secondary | ICD-10-CM | POA: Diagnosis not present

## 2012-09-22 DIAGNOSIS — E039 Hypothyroidism, unspecified: Secondary | ICD-10-CM | POA: Diagnosis not present

## 2012-09-22 DIAGNOSIS — K219 Gastro-esophageal reflux disease without esophagitis: Secondary | ICD-10-CM | POA: Diagnosis not present

## 2012-09-22 DIAGNOSIS — Z0000 Encounter for general adult medical examination without abnormal findings: Secondary | ICD-10-CM | POA: Diagnosis not present

## 2012-09-22 DIAGNOSIS — I10 Essential (primary) hypertension: Secondary | ICD-10-CM | POA: Diagnosis not present

## 2012-09-26 DIAGNOSIS — H4010X0 Unspecified open-angle glaucoma, stage unspecified: Secondary | ICD-10-CM | POA: Diagnosis not present

## 2012-09-26 DIAGNOSIS — H2510 Age-related nuclear cataract, unspecified eye: Secondary | ICD-10-CM | POA: Diagnosis not present

## 2012-09-26 DIAGNOSIS — H26499 Other secondary cataract, unspecified eye: Secondary | ICD-10-CM | POA: Diagnosis not present

## 2012-09-26 DIAGNOSIS — Z1231 Encounter for screening mammogram for malignant neoplasm of breast: Secondary | ICD-10-CM | POA: Diagnosis not present

## 2012-09-26 DIAGNOSIS — Z23 Encounter for immunization: Secondary | ICD-10-CM | POA: Diagnosis not present

## 2012-09-28 DIAGNOSIS — H4010X0 Unspecified open-angle glaucoma, stage unspecified: Secondary | ICD-10-CM | POA: Diagnosis not present

## 2012-09-28 DIAGNOSIS — H43819 Vitreous degeneration, unspecified eye: Secondary | ICD-10-CM | POA: Diagnosis not present

## 2012-09-28 DIAGNOSIS — H2510 Age-related nuclear cataract, unspecified eye: Secondary | ICD-10-CM | POA: Diagnosis not present

## 2012-09-28 DIAGNOSIS — H26499 Other secondary cataract, unspecified eye: Secondary | ICD-10-CM | POA: Diagnosis not present

## 2013-12-20 DIAGNOSIS — M1710 Unilateral primary osteoarthritis, unspecified knee: Secondary | ICD-10-CM | POA: Diagnosis not present

## 2013-12-20 DIAGNOSIS — E785 Hyperlipidemia, unspecified: Secondary | ICD-10-CM | POA: Diagnosis not present

## 2013-12-20 DIAGNOSIS — M19019 Primary osteoarthritis, unspecified shoulder: Secondary | ICD-10-CM | POA: Diagnosis not present

## 2013-12-20 DIAGNOSIS — M25519 Pain in unspecified shoulder: Secondary | ICD-10-CM | POA: Diagnosis not present

## 2013-12-20 DIAGNOSIS — K219 Gastro-esophageal reflux disease without esophagitis: Secondary | ICD-10-CM | POA: Diagnosis not present

## 2013-12-20 DIAGNOSIS — Z23 Encounter for immunization: Secondary | ICD-10-CM | POA: Diagnosis not present

## 2013-12-20 DIAGNOSIS — N189 Chronic kidney disease, unspecified: Secondary | ICD-10-CM | POA: Diagnosis not present

## 2013-12-20 DIAGNOSIS — D649 Anemia, unspecified: Secondary | ICD-10-CM | POA: Diagnosis not present

## 2013-12-20 DIAGNOSIS — E039 Hypothyroidism, unspecified: Secondary | ICD-10-CM | POA: Diagnosis not present

## 2013-12-20 DIAGNOSIS — I10 Essential (primary) hypertension: Secondary | ICD-10-CM | POA: Diagnosis not present

## 2013-12-21 DIAGNOSIS — H409 Unspecified glaucoma: Secondary | ICD-10-CM | POA: Diagnosis not present

## 2013-12-21 DIAGNOSIS — H2510 Age-related nuclear cataract, unspecified eye: Secondary | ICD-10-CM | POA: Diagnosis not present

## 2013-12-21 DIAGNOSIS — H401290 Low-tension glaucoma, unspecified eye, stage unspecified: Secondary | ICD-10-CM | POA: Diagnosis not present

## 2013-12-21 DIAGNOSIS — H353190 Nonexudative age-related macular degeneration, unspecified eye, stage unspecified: Secondary | ICD-10-CM | POA: Diagnosis not present

## 2013-12-28 DIAGNOSIS — L570 Actinic keratosis: Secondary | ICD-10-CM | POA: Diagnosis not present

## 2013-12-28 DIAGNOSIS — L719 Rosacea, unspecified: Secondary | ICD-10-CM | POA: Diagnosis not present

## 2013-12-28 DIAGNOSIS — Z961 Presence of intraocular lens: Secondary | ICD-10-CM | POA: Diagnosis not present

## 2013-12-28 DIAGNOSIS — H2510 Age-related nuclear cataract, unspecified eye: Secondary | ICD-10-CM | POA: Diagnosis not present

## 2013-12-28 DIAGNOSIS — D485 Neoplasm of uncertain behavior of skin: Secondary | ICD-10-CM | POA: Diagnosis not present

## 2013-12-28 DIAGNOSIS — D1801 Hemangioma of skin and subcutaneous tissue: Secondary | ICD-10-CM | POA: Diagnosis not present

## 2013-12-28 DIAGNOSIS — D235 Other benign neoplasm of skin of trunk: Secondary | ICD-10-CM | POA: Diagnosis not present

## 2013-12-28 DIAGNOSIS — L821 Other seborrheic keratosis: Secondary | ICD-10-CM | POA: Diagnosis not present

## 2014-01-01 DIAGNOSIS — D2330 Other benign neoplasm of skin of unspecified part of face: Secondary | ICD-10-CM | POA: Diagnosis not present

## 2014-01-01 DIAGNOSIS — L570 Actinic keratosis: Secondary | ICD-10-CM | POA: Diagnosis not present

## 2014-01-17 DIAGNOSIS — Z83518 Family history of other specified eye disorder: Secondary | ICD-10-CM | POA: Diagnosis not present

## 2014-01-17 DIAGNOSIS — Z833 Family history of diabetes mellitus: Secondary | ICD-10-CM | POA: Diagnosis not present

## 2014-01-17 DIAGNOSIS — Z83511 Family history of glaucoma: Secondary | ICD-10-CM | POA: Diagnosis not present

## 2014-01-17 DIAGNOSIS — H2511 Age-related nuclear cataract, right eye: Secondary | ICD-10-CM | POA: Diagnosis not present

## 2014-01-17 DIAGNOSIS — Z9889 Other specified postprocedural states: Secondary | ICD-10-CM | POA: Diagnosis not present

## 2014-01-17 DIAGNOSIS — Z79899 Other long term (current) drug therapy: Secondary | ICD-10-CM | POA: Diagnosis not present

## 2014-01-17 DIAGNOSIS — I10 Essential (primary) hypertension: Secondary | ICD-10-CM | POA: Diagnosis not present

## 2014-01-17 DIAGNOSIS — Z8249 Family history of ischemic heart disease and other diseases of the circulatory system: Secondary | ICD-10-CM | POA: Diagnosis not present

## 2014-01-17 DIAGNOSIS — Z809 Family history of malignant neoplasm, unspecified: Secondary | ICD-10-CM | POA: Diagnosis not present

## 2014-01-17 DIAGNOSIS — E119 Type 2 diabetes mellitus without complications: Secondary | ICD-10-CM | POA: Diagnosis not present

## 2014-01-17 DIAGNOSIS — H269 Unspecified cataract: Secondary | ICD-10-CM | POA: Diagnosis not present

## 2014-01-22 DIAGNOSIS — E785 Hyperlipidemia, unspecified: Secondary | ICD-10-CM | POA: Diagnosis not present

## 2015-02-19 DIAGNOSIS — M1711 Unilateral primary osteoarthritis, right knee: Secondary | ICD-10-CM | POA: Diagnosis not present

## 2015-02-19 DIAGNOSIS — M25512 Pain in left shoulder: Secondary | ICD-10-CM | POA: Diagnosis not present

## 2015-02-19 DIAGNOSIS — M25511 Pain in right shoulder: Secondary | ICD-10-CM | POA: Diagnosis not present

## 2015-02-19 DIAGNOSIS — Z96652 Presence of left artificial knee joint: Secondary | ICD-10-CM | POA: Diagnosis not present

## 2015-02-21 DIAGNOSIS — H524 Presbyopia: Secondary | ICD-10-CM | POA: Diagnosis not present

## 2015-02-21 DIAGNOSIS — H401231 Low-tension glaucoma, bilateral, mild stage: Secondary | ICD-10-CM | POA: Diagnosis not present

## 2015-02-21 DIAGNOSIS — Z961 Presence of intraocular lens: Secondary | ICD-10-CM | POA: Diagnosis not present

## 2015-02-21 DIAGNOSIS — H353130 Nonexudative age-related macular degeneration, bilateral, stage unspecified: Secondary | ICD-10-CM | POA: Diagnosis not present

## 2015-02-24 DIAGNOSIS — I10 Essential (primary) hypertension: Secondary | ICD-10-CM | POA: Diagnosis not present

## 2015-02-24 DIAGNOSIS — E039 Hypothyroidism, unspecified: Secondary | ICD-10-CM | POA: Diagnosis not present

## 2015-02-24 DIAGNOSIS — D649 Anemia, unspecified: Secondary | ICD-10-CM | POA: Diagnosis not present

## 2015-02-24 DIAGNOSIS — E7800 Pure hypercholesterolemia, unspecified: Secondary | ICD-10-CM | POA: Diagnosis not present

## 2015-02-25 DIAGNOSIS — Z0000 Encounter for general adult medical examination without abnormal findings: Secondary | ICD-10-CM | POA: Diagnosis not present

## 2015-02-25 DIAGNOSIS — E784 Other hyperlipidemia: Secondary | ICD-10-CM | POA: Diagnosis not present

## 2015-02-25 DIAGNOSIS — I10 Essential (primary) hypertension: Secondary | ICD-10-CM | POA: Diagnosis not present

## 2015-02-25 DIAGNOSIS — Z23 Encounter for immunization: Secondary | ICD-10-CM | POA: Diagnosis not present

## 2015-02-25 DIAGNOSIS — X32XXXA Exposure to sunlight, initial encounter: Secondary | ICD-10-CM | POA: Diagnosis not present

## 2015-02-25 DIAGNOSIS — G479 Sleep disorder, unspecified: Secondary | ICD-10-CM | POA: Diagnosis not present

## 2015-02-25 DIAGNOSIS — L570 Actinic keratosis: Secondary | ICD-10-CM | POA: Diagnosis not present

## 2015-02-25 DIAGNOSIS — E039 Hypothyroidism, unspecified: Secondary | ICD-10-CM | POA: Diagnosis not present

## 2015-02-25 DIAGNOSIS — D225 Melanocytic nevi of trunk: Secondary | ICD-10-CM | POA: Diagnosis not present

## 2015-02-25 DIAGNOSIS — L821 Other seborrheic keratosis: Secondary | ICD-10-CM | POA: Diagnosis not present

## 2015-02-25 DIAGNOSIS — M1990 Unspecified osteoarthritis, unspecified site: Secondary | ICD-10-CM | POA: Diagnosis not present

## 2015-02-25 DIAGNOSIS — K219 Gastro-esophageal reflux disease without esophagitis: Secondary | ICD-10-CM | POA: Diagnosis not present

## 2015-02-26 DIAGNOSIS — D225 Melanocytic nevi of trunk: Secondary | ICD-10-CM | POA: Diagnosis not present

## 2015-02-26 DIAGNOSIS — M1711 Unilateral primary osteoarthritis, right knee: Secondary | ICD-10-CM | POA: Diagnosis not present

## 2015-02-28 DIAGNOSIS — R269 Unspecified abnormalities of gait and mobility: Secondary | ICD-10-CM | POA: Diagnosis not present

## 2015-02-28 DIAGNOSIS — G8929 Other chronic pain: Secondary | ICD-10-CM | POA: Diagnosis not present

## 2015-02-28 DIAGNOSIS — M25572 Pain in left ankle and joints of left foot: Secondary | ICD-10-CM | POA: Diagnosis not present

## 2015-02-28 DIAGNOSIS — M659 Synovitis and tenosynovitis, unspecified: Secondary | ICD-10-CM | POA: Diagnosis not present

## 2015-02-28 DIAGNOSIS — M25673 Stiffness of unspecified ankle, not elsewhere classified: Secondary | ICD-10-CM | POA: Diagnosis not present

## 2015-02-28 DIAGNOSIS — Z7409 Other reduced mobility: Secondary | ICD-10-CM | POA: Diagnosis not present

## 2015-03-04 DIAGNOSIS — G8929 Other chronic pain: Secondary | ICD-10-CM | POA: Diagnosis not present

## 2015-03-04 DIAGNOSIS — M25572 Pain in left ankle and joints of left foot: Secondary | ICD-10-CM | POA: Diagnosis not present

## 2015-03-04 DIAGNOSIS — Z7409 Other reduced mobility: Secondary | ICD-10-CM | POA: Diagnosis not present

## 2015-03-04 DIAGNOSIS — R269 Unspecified abnormalities of gait and mobility: Secondary | ICD-10-CM | POA: Diagnosis not present

## 2015-03-04 DIAGNOSIS — M659 Synovitis and tenosynovitis, unspecified: Secondary | ICD-10-CM | POA: Diagnosis not present

## 2015-03-04 DIAGNOSIS — M25673 Stiffness of unspecified ankle, not elsewhere classified: Secondary | ICD-10-CM | POA: Diagnosis not present

## 2015-03-05 DIAGNOSIS — Z961 Presence of intraocular lens: Secondary | ICD-10-CM

## 2015-03-05 DIAGNOSIS — H353190 Nonexudative age-related macular degeneration, unspecified eye, stage unspecified: Secondary | ICD-10-CM

## 2015-03-05 DIAGNOSIS — H353130 Nonexudative age-related macular degeneration, bilateral, stage unspecified: Secondary | ICD-10-CM | POA: Diagnosis not present

## 2015-03-05 DIAGNOSIS — H401231 Low-tension glaucoma, bilateral, mild stage: Secondary | ICD-10-CM

## 2015-03-05 DIAGNOSIS — H524 Presbyopia: Secondary | ICD-10-CM

## 2015-03-05 DIAGNOSIS — M1711 Unilateral primary osteoarthritis, right knee: Secondary | ICD-10-CM | POA: Diagnosis not present

## 2015-03-05 HISTORY — DX: Presbyopia: H52.4

## 2015-03-05 HISTORY — DX: Presence of intraocular lens: Z96.1

## 2015-03-05 HISTORY — DX: Low-tension glaucoma, bilateral, mild stage: H40.1231

## 2015-03-05 HISTORY — DX: Nonexudative age-related macular degeneration, unspecified eye, stage unspecified: H35.3190

## 2015-03-06 DIAGNOSIS — M659 Synovitis and tenosynovitis, unspecified: Secondary | ICD-10-CM | POA: Diagnosis not present

## 2015-03-06 DIAGNOSIS — G8929 Other chronic pain: Secondary | ICD-10-CM | POA: Diagnosis not present

## 2015-03-06 DIAGNOSIS — M25673 Stiffness of unspecified ankle, not elsewhere classified: Secondary | ICD-10-CM | POA: Diagnosis not present

## 2015-03-06 DIAGNOSIS — Z7409 Other reduced mobility: Secondary | ICD-10-CM | POA: Diagnosis not present

## 2015-03-06 DIAGNOSIS — M25572 Pain in left ankle and joints of left foot: Secondary | ICD-10-CM | POA: Diagnosis not present

## 2015-03-06 DIAGNOSIS — R269 Unspecified abnormalities of gait and mobility: Secondary | ICD-10-CM | POA: Diagnosis not present

## 2015-03-10 DIAGNOSIS — M25673 Stiffness of unspecified ankle, not elsewhere classified: Secondary | ICD-10-CM | POA: Diagnosis not present

## 2015-03-10 DIAGNOSIS — M25572 Pain in left ankle and joints of left foot: Secondary | ICD-10-CM | POA: Diagnosis not present

## 2015-03-10 DIAGNOSIS — Z7409 Other reduced mobility: Secondary | ICD-10-CM | POA: Diagnosis not present

## 2015-03-10 DIAGNOSIS — R269 Unspecified abnormalities of gait and mobility: Secondary | ICD-10-CM | POA: Diagnosis not present

## 2015-03-10 DIAGNOSIS — G8929 Other chronic pain: Secondary | ICD-10-CM | POA: Diagnosis not present

## 2015-03-10 DIAGNOSIS — M659 Synovitis and tenosynovitis, unspecified: Secondary | ICD-10-CM | POA: Diagnosis not present

## 2015-03-12 DIAGNOSIS — G8929 Other chronic pain: Secondary | ICD-10-CM | POA: Diagnosis not present

## 2015-03-12 DIAGNOSIS — M659 Synovitis and tenosynovitis, unspecified: Secondary | ICD-10-CM | POA: Diagnosis not present

## 2015-03-12 DIAGNOSIS — R269 Unspecified abnormalities of gait and mobility: Secondary | ICD-10-CM | POA: Diagnosis not present

## 2015-03-12 DIAGNOSIS — Z7409 Other reduced mobility: Secondary | ICD-10-CM | POA: Diagnosis not present

## 2015-03-12 DIAGNOSIS — M25673 Stiffness of unspecified ankle, not elsewhere classified: Secondary | ICD-10-CM | POA: Diagnosis not present

## 2015-03-12 DIAGNOSIS — M25572 Pain in left ankle and joints of left foot: Secondary | ICD-10-CM | POA: Diagnosis not present

## 2015-09-22 ENCOUNTER — Other Ambulatory Visit: Payer: Self-pay | Admitting: Orthopedic Surgery

## 2015-09-22 DIAGNOSIS — M25512 Pain in left shoulder: Secondary | ICD-10-CM

## 2015-10-15 ENCOUNTER — Other Ambulatory Visit: Payer: Self-pay | Admitting: Orthopedic Surgery

## 2015-11-27 ENCOUNTER — Ambulatory Visit
Admission: RE | Admit: 2015-11-27 | Discharge: 2015-11-27 | Disposition: A | Discharge status: Home / Self Care | Payer: Medicare Other | Source: Ambulatory Visit | Attending: Orthopedic Surgery | Admitting: Orthopedic Surgery

## 2015-11-27 DIAGNOSIS — M25512 Pain in left shoulder: Secondary | ICD-10-CM

## 2015-11-27 DIAGNOSIS — M19012 Primary osteoarthritis, left shoulder: Secondary | ICD-10-CM | POA: Diagnosis not present

## 2015-11-27 NOTE — Pre-Procedure Instructions (Signed)
[facility]  11/27/2015      [place] [place] [address] [place] [address] Phone: [phone] Fax: [phone]  [facility] [place], [place] - [place] [address] [address] Phone: [phone] Fax: [phone]    Your procedure is scheduled on Tuesday August 22.  Report to [facility] Admitting at 5:30 A.M.   Call this number if you have problems the morning of surgery:  [phone]   Remember:  Do not eat food or drink liquids after midnight.   Take these medicines the morning of surgery with A SIP OF WATER: Acetaminophen (Tylenol) if needed, Atenolol (Tenormin), Fexofenadine (Allegra), Levothyroxine Sodium, Pantoprazole (Protonix).  7 days prior to surgery, stop taking: Diclofenac (Voltaren), Aspirin, NSAIDS, Aleve, Naproxen, Ibuprofen, Advil, Motrin, BC's, Goody's, Fish oil, all herbal medications, and all vitamins.     Do not wear jewelry, make-up or nail polish.  Do not wear lotions, powders, or perfumes.  You may NOT wear deoderant.  Do not shave 48 hours prior to surgery.    Do not bring valuables to the hospital.  [facility] is not responsible for any belongings or valuables.  Contacts, dentures or bridgework may not be worn into surgery.  Leave your suitcase in the car.  After surgery it may be brought to your room.  For patients admitted to the hospital, discharge time will be determined by your treatment team.  Patients discharged the day of surgery will not be allowed to drive home.   [facility]- Preparing For Surgery  Before surgery, you can play an important role. Because skin is not sterile, your skin needs to be as free of germs as possible. You can reduce the number of germs on your skin by washing with CHG (chlorahexidine gluconate) Soap before surgery.  CHG is an antiseptic cleaner which kills germs and bonds with the skin to  continue killing germs even after washing.  Please do not use if you have an allergy to CHG or antibacterial soaps. If your skin becomes reddened/irritated stop using the CHG.  Do not shave (including legs and underarms) for at least 48 hours prior to first CHG shower. It is OK to shave your face.  Please follow these instructions carefully.   1. Shower the NIGHT BEFORE SURGERY and the MORNING OF SURGERY with CHG.   2. If you chose to wash your hair, wash your hair first as usual with your normal shampoo.  3. After you shampoo, rinse your hair and body thoroughly to remove the shampoo.  4. Use CHG as you would any other liquid soap. You can apply CHG directly to the skin and wash gently with a scrungie or a clean washcloth.   5. Apply the CHG Soap to your body ONLY FROM THE NECK DOWN.  Do not use on open wounds or open sores. Avoid contact with your eyes, ears, mouth and genitals (private parts). Wash genitals (private parts) with your normal soap.  6. Wash thoroughly, paying special attention to the area where your surgery will be performed.  7. Thoroughly rinse your body with warm water from the neck down.  8. DO NOT shower/wash with your normal soap after using and rinsing off the CHG Soap.  9. Pat yourself dry with a CLEAN TOWEL.   10. Wear CLEAN PAJAMAS   11. Place CLEAN SHEETS on your bed the night of  your first shower and DO NOT SLEEP WITH PETS.  Day of Surgery: Do not apply any deodorants/lotions. Please wear clean clothes to the hospital/surgery center.    Please read over the following fact sheets that you were given. MRSA Information

## 2015-11-28 ENCOUNTER — Encounter (HOSPITAL_COMMUNITY)
Admission: RE | Admit: 2015-11-28 | Discharge: 2015-11-28 | Disposition: A | Discharge status: Home / Self Care | Payer: Medicare Other | Source: Ambulatory Visit | Attending: Orthopedic Surgery | Admitting: Orthopedic Surgery

## 2015-11-28 ENCOUNTER — Encounter (HOSPITAL_COMMUNITY): Payer: Self-pay

## 2015-11-28 DIAGNOSIS — M19012 Primary osteoarthritis, left shoulder: Secondary | ICD-10-CM | POA: Insufficient documentation

## 2015-11-28 DIAGNOSIS — Z01812 Encounter for preprocedural laboratory examination: Secondary | ICD-10-CM | POA: Insufficient documentation

## 2015-11-28 DIAGNOSIS — I10 Essential (primary) hypertension: Secondary | ICD-10-CM | POA: Diagnosis not present

## 2015-11-28 DIAGNOSIS — Z01818 Encounter for other preprocedural examination: Secondary | ICD-10-CM | POA: Diagnosis not present

## 2015-11-28 HISTORY — DX: Personal history of urinary calculi: Z87.442

## 2015-11-28 HISTORY — DX: Personal history of pneumonia (recurrent): Z87.01

## 2015-11-28 HISTORY — DX: Iron deficiency anemia, unspecified: D50.9

## 2015-11-28 HISTORY — DX: Personal history of other diseases of the respiratory system: Z87.09

## 2015-11-28 HISTORY — DX: Gastro-esophageal reflux disease without esophagitis: K21.9

## 2015-11-28 HISTORY — DX: Anesthesia of skin: R20.0

## 2015-11-28 LAB — BASIC METABOLIC PANEL
ANION GAP: 10 (ref 5–15)
BUN: 18 mg/dL (ref 6–20)
CALCIUM: 9.3 mg/dL (ref 8.9–10.3)
CO2: 26 mmol/L (ref 22–32)
Chloride: 100 mmol/L — ABNORMAL LOW (ref 101–111)
Creatinine, Ser: 0.9 mg/dL (ref 0.44–1.00)
GFR, EST NON AFRICAN AMERICAN: 60 mL/min — AB (ref >60)
GLUCOSE: 89 mg/dL (ref 65–99)
POTASSIUM: 4.6 mmol/L (ref 3.5–5.1)
Sodium: 136 mmol/L (ref 135–145)

## 2015-11-28 LAB — CBC
HEMATOCRIT: 35.9 % — AB (ref 36.0–46.0)
HEMOGLOBIN: 11.1 g/dL — AB (ref 12.0–15.0)
MCH: 29 pg (ref 26.0–34.0)
MCHC: 30.9 g/dL (ref 30.0–36.0)
MCV: 93.7 fL (ref 78.0–100.0)
Platelets: 261 10*3/uL (ref 150–400)
RBC: 3.83 MIL/uL — AB (ref 3.87–5.11)
RDW: 14.3 % (ref 11.5–15.5)
WBC: 6.3 10*3/uL (ref 4.0–10.5)

## 2015-11-28 LAB — SURGICAL PCR SCREEN
MRSA, PCR: NEGATIVE
STAPHYLOCOCCUS AUREUS: NEGATIVE

## 2015-11-28 NOTE — Progress Notes (Signed)
PCP - Dr. Shirline Frees Cardiologist - denies  EKG - 11/28/15 CXR - denies  Echo/Stress test/Cardiac Cath - denies  Patient denies chest pain and shortness of breath at PAT appointment.  Patient informs nurse that she currently lives in [place].

## 2015-12-01 ENCOUNTER — Other Ambulatory Visit: Payer: Self-pay | Admitting: Family Medicine

## 2015-12-01 ENCOUNTER — Ambulatory Visit
Admission: RE | Admit: 2015-12-01 | Discharge: 2015-12-01 | Disposition: A | Discharge status: Home / Self Care | Payer: Medicare Other | Source: Ambulatory Visit | Attending: Family Medicine | Admitting: Family Medicine

## 2015-12-01 DIAGNOSIS — F5101 Primary insomnia: Secondary | ICD-10-CM | POA: Diagnosis not present

## 2015-12-01 DIAGNOSIS — I10 Essential (primary) hypertension: Secondary | ICD-10-CM | POA: Diagnosis not present

## 2015-12-01 DIAGNOSIS — M25572 Pain in left ankle and joints of left foot: Secondary | ICD-10-CM

## 2015-12-01 DIAGNOSIS — E7800 Pure hypercholesterolemia, unspecified: Secondary | ICD-10-CM | POA: Diagnosis not present

## 2015-12-01 DIAGNOSIS — K219 Gastro-esophageal reflux disease without esophagitis: Secondary | ICD-10-CM | POA: Diagnosis not present

## 2015-12-01 DIAGNOSIS — M19072 Primary osteoarthritis, left ankle and foot: Secondary | ICD-10-CM | POA: Diagnosis not present

## 2015-12-01 DIAGNOSIS — M7989 Other specified soft tissue disorders: Secondary | ICD-10-CM | POA: Diagnosis not present

## 2015-12-01 DIAGNOSIS — M19012 Primary osteoarthritis, left shoulder: Secondary | ICD-10-CM | POA: Diagnosis not present

## 2015-12-01 DIAGNOSIS — E039 Hypothyroidism, unspecified: Secondary | ICD-10-CM | POA: Diagnosis not present

## 2015-12-04 DIAGNOSIS — E7800 Pure hypercholesterolemia, unspecified: Secondary | ICD-10-CM | POA: Diagnosis not present

## 2015-12-04 DIAGNOSIS — E039 Hypothyroidism, unspecified: Secondary | ICD-10-CM | POA: Diagnosis not present

## 2015-12-04 DIAGNOSIS — I10 Essential (primary) hypertension: Secondary | ICD-10-CM | POA: Diagnosis not present

## 2015-12-08 NOTE — Anesthesia Preprocedure Evaluation (Addendum)
Anesthesia Evaluation  Patient identified by MRN, date of birth, ID band Patient awake    Reviewed: Allergy & Precautions, H&P , NPO status , Patient's Chart, lab work & pertinent test results  Airway Mallampati: III  TM Distance: >3 FB Neck ROM: Full    Dental no notable dental hx. (+) Teeth Intact, Dental Advisory Given   Pulmonary neg pulmonary ROS,    Pulmonary exam normal breath sounds clear to auscultation       Cardiovascular hypertension, Pt. on medications and Pt. on home beta blockers  Rhythm:Regular Rate:Normal     Neuro/Psych negative neurological ROS  negative psych ROS   GI/Hepatic Neg liver ROS, GERD  Medicated and Controlled,  Endo/Other  negative endocrine ROS  Renal/GU negative Renal ROS  negative genitourinary   Musculoskeletal  (+) Arthritis , Osteoarthritis,    Abdominal   Peds  Hematology negative hematology ROS (+)   Anesthesia Other Findings   Reproductive/Obstetrics negative OB ROS                            Anesthesia Physical Anesthesia Plan  ASA: II  Anesthesia Plan: General and Regional   Post-op Pain Management: GA combined w/ Regional for post-op pain   Induction: Intravenous  Airway Management Planned: Oral ETT  Additional Equipment:   Intra-op Plan:   Post-operative Plan: Extubation in OR  Informed Consent: I have reviewed the patients History and Physical, chart, labs and discussed the procedure including the risks, benefits and alternatives for the proposed anesthesia with the patient or authorized representative who has indicated his/her understanding and acceptance.   Dental advisory given  Plan Discussed with: CRNA  Anesthesia Plan Comments:         Anesthesia Quick Evaluation

## 2015-12-09 ENCOUNTER — Encounter (HOSPITAL_COMMUNITY)
Admission: RE | Disposition: A | Discharge status: Home Health | Payer: Self-pay | Source: Ambulatory Visit | Attending: Orthopedic Surgery

## 2015-12-09 ENCOUNTER — Inpatient Hospital Stay (HOSPITAL_COMMUNITY): Payer: Medicare Other | Admitting: Anesthesiology

## 2015-12-09 ENCOUNTER — Inpatient Hospital Stay (HOSPITAL_COMMUNITY)
Admission: RE | Admit: 2015-12-09 | Discharge: 2015-12-10 | DRG: 483 | Disposition: A | Discharge status: Home Health | Payer: Medicare Other | Source: Ambulatory Visit | Attending: Orthopedic Surgery | Admitting: Orthopedic Surgery

## 2015-12-09 ENCOUNTER — Encounter (HOSPITAL_COMMUNITY): Payer: Self-pay | Admitting: Orthopedic Surgery

## 2015-12-09 ENCOUNTER — Inpatient Hospital Stay (HOSPITAL_COMMUNITY): Payer: Medicare Other

## 2015-12-09 DIAGNOSIS — Z885 Allergy status to narcotic agent status: Secondary | ICD-10-CM | POA: Diagnosis not present

## 2015-12-09 DIAGNOSIS — H409 Unspecified glaucoma: Secondary | ICD-10-CM | POA: Diagnosis present

## 2015-12-09 DIAGNOSIS — Z96611 Presence of right artificial shoulder joint: Secondary | ICD-10-CM | POA: Diagnosis not present

## 2015-12-09 DIAGNOSIS — Z881 Allergy status to other antibiotic agents status: Secondary | ICD-10-CM | POA: Diagnosis not present

## 2015-12-09 DIAGNOSIS — Z7982 Long term (current) use of aspirin: Secondary | ICD-10-CM

## 2015-12-09 DIAGNOSIS — Z888 Allergy status to other drugs, medicaments and biological substances status: Secondary | ICD-10-CM | POA: Diagnosis not present

## 2015-12-09 DIAGNOSIS — M12812 Other specific arthropathies, not elsewhere classified, left shoulder: Principal | ICD-10-CM

## 2015-12-09 DIAGNOSIS — M19012 Primary osteoarthritis, left shoulder: Secondary | ICD-10-CM | POA: Diagnosis not present

## 2015-12-09 DIAGNOSIS — G8918 Other acute postprocedural pain: Secondary | ICD-10-CM | POA: Diagnosis not present

## 2015-12-09 DIAGNOSIS — H3530 Unspecified macular degeneration: Secondary | ICD-10-CM | POA: Diagnosis present

## 2015-12-09 DIAGNOSIS — Z96619 Presence of unspecified artificial shoulder joint: Secondary | ICD-10-CM

## 2015-12-09 DIAGNOSIS — Z79899 Other long term (current) drug therapy: Secondary | ICD-10-CM

## 2015-12-09 DIAGNOSIS — D649 Anemia, unspecified: Secondary | ICD-10-CM

## 2015-12-09 DIAGNOSIS — I10 Essential (primary) hypertension: Secondary | ICD-10-CM | POA: Diagnosis present

## 2015-12-09 DIAGNOSIS — K219 Gastro-esophageal reflux disease without esophagitis: Secondary | ICD-10-CM | POA: Diagnosis not present

## 2015-12-09 DIAGNOSIS — Z96612 Presence of left artificial shoulder joint: Secondary | ICD-10-CM | POA: Diagnosis not present

## 2015-12-09 DIAGNOSIS — M75102 Unspecified rotator cuff tear or rupture of left shoulder, not specified as traumatic: Secondary | ICD-10-CM | POA: Diagnosis present

## 2015-12-09 DIAGNOSIS — Z471 Aftercare following joint replacement surgery: Secondary | ICD-10-CM | POA: Diagnosis not present

## 2015-12-09 HISTORY — DX: Other specific arthropathies, not elsewhere classified, left shoulder: M12.812

## 2015-12-09 HISTORY — PX: REVERSE SHOULDER ARTHROPLASTY: SHX5054

## 2015-12-09 HISTORY — DX: Unspecified rotator cuff tear or rupture of left shoulder, not specified as traumatic: M75.102

## 2015-12-09 HISTORY — DX: Presence of unspecified artificial shoulder joint: Z96.619

## 2015-12-09 SURGERY — ARTHROPLASTY, SHOULDER, TOTAL, REVERSE
Anesthesia: Regional | Site: Shoulder | Laterality: Left

## 2015-12-09 MED ORDER — FENTANYL CITRATE (PF) 100 MCG/2ML IJ SOLN
INTRAMUSCULAR | Status: AC
  Filled 2015-12-09: qty 2

## 2015-12-09 MED ORDER — MAGNESIUM CITRATE PO SOLN: 1.0000 | Freq: Once | ORAL | Status: DC | PRN

## 2015-12-09 MED ORDER — BUPIVACAINE-EPINEPHRINE (PF) 0.5% -1:200000 IJ SOLN
INTRAMUSCULAR | Status: DC | PRN
  Administered 2015-12-09: 30 mL via PERINEURAL

## 2015-12-09 MED ORDER — 0.9 % SODIUM CHLORIDE (POUR BTL) OPTIME
TOPICAL | Status: DC | PRN
  Administered 2015-12-09: 1000 mL

## 2015-12-09 MED ORDER — POLYETHYLENE GLYCOL 3350 17 G PO PACK: 17.0000 g | PACK | Freq: Every day | ORAL | Status: DC | PRN

## 2015-12-09 MED ORDER — ONDANSETRON HCL 4 MG/2ML IJ SOLN: 4.0000 mg | Freq: Four times a day (QID) | INTRAMUSCULAR | Status: DC | PRN

## 2015-12-09 MED ORDER — CYCLOBENZAPRINE HCL 10 MG PO TABS: 10.0000 mg | ORAL_TABLET | Freq: Three times a day (TID) | ORAL | 0 refills | Status: DC | PRN

## 2015-12-09 MED ORDER — ZOLPIDEM TARTRATE 5 MG PO TABS
5.0000 mg | ORAL_TABLET | Freq: Every evening | ORAL | Status: DC | PRN
  Administered 2015-12-09: 5 mg via ORAL
  Filled 2015-12-09: qty 1

## 2015-12-09 MED ORDER — PROPOFOL 10 MG/ML IV BOLUS
INTRAVENOUS | Status: DC | PRN
  Administered 2015-12-09: 80 mg via INTRAVENOUS

## 2015-12-09 MED ORDER — VITAMIN D (CHOLECALCIFEROL) 10 MCG (400 UNIT) PO CHEW: 400.0000 [IU] | CHEWABLE_TABLET | Freq: Every day | ORAL | Status: DC

## 2015-12-09 MED ORDER — DEXAMETHASONE SODIUM PHOSPHATE 4 MG/ML IJ SOLN
INTRAMUSCULAR | Status: DC | PRN
  Administered 2015-12-09: 10 mg via INTRAVENOUS

## 2015-12-09 MED ORDER — LYSINE 1000 MG PO TABS: 1000.0000 mg | ORAL_TABLET | Freq: Every day | ORAL | Status: DC

## 2015-12-09 MED ORDER — CHOLECALCIFEROL 10 MCG (400 UNIT) PO TABS
400.0000 [IU] | ORAL_TABLET | Freq: Every day | ORAL | Status: DC
  Administered 2015-12-10: 400 [IU] via ORAL
  Filled 2015-12-09: qty 1

## 2015-12-09 MED ORDER — CEFAZOLIN IN D5W 1 GM/50ML IV SOLN
1.0000 g | Freq: Four times a day (QID) | INTRAVENOUS | Status: AC
  Administered 2015-12-09 – 2015-12-10 (×3): 1 g via INTRAVENOUS
  Filled 2015-12-09 (×3): qty 50

## 2015-12-09 MED ORDER — MENTHOL 3 MG MT LOZG: 1.0000 | LOZENGE | OROMUCOSAL | Status: DC | PRN

## 2015-12-09 MED ORDER — SENNA 8.6 MG PO TABS
1.0000 | ORAL_TABLET | Freq: Two times a day (BID) | ORAL | Status: DC
  Administered 2015-12-09 – 2015-12-10 (×3): 8.6 mg via ORAL
  Filled 2015-12-09 (×3): qty 1

## 2015-12-09 MED ORDER — ONDANSETRON HCL 4 MG PO TABS: 4.0000 mg | ORAL_TABLET | Freq: Three times a day (TID) | ORAL | 0 refills | Status: DC | PRN

## 2015-12-09 MED ORDER — PHENYLEPHRINE HCL 10 MG/ML IJ SOLN
INTRAVENOUS | Status: DC | PRN
  Administered 2015-12-09: 25 ug/min via INTRAVENOUS

## 2015-12-09 MED ORDER — OMEGA-3 FATTY ACIDS 1000 MG PO CAPS: 1.0000 g | ORAL_CAPSULE | ORAL | Status: DC

## 2015-12-09 MED ORDER — PROPOFOL 10 MG/ML IV BOLUS
INTRAVENOUS | Status: AC
  Filled 2015-12-09: qty 20

## 2015-12-09 MED ORDER — ADULT MULTIVITAMIN W/MINERALS CH
1.0000 | ORAL_TABLET | Freq: Every day | ORAL | Status: DC
  Administered 2015-12-10: 1 via ORAL
  Filled 2015-12-09: qty 1

## 2015-12-09 MED ORDER — ASPIRIN EC 81 MG PO TBEC
81.0000 mg | DELAYED_RELEASE_TABLET | Freq: Every day | ORAL | Status: DC
  Administered 2015-12-10: 81 mg via ORAL
  Filled 2015-12-09: qty 1

## 2015-12-09 MED ORDER — ACETAMINOPHEN 325 MG PO TABS
650.0000 mg | ORAL_TABLET | Freq: Four times a day (QID) | ORAL | Status: DC | PRN
  Administered 2015-12-10: 650 mg via ORAL
  Filled 2015-12-09: qty 2

## 2015-12-09 MED ORDER — DIPHENHYDRAMINE HCL 12.5 MG/5ML PO ELIX: 12.5000 mg | ORAL_SOLUTION | ORAL | Status: DC | PRN

## 2015-12-09 MED ORDER — SUGAMMADEX SODIUM 200 MG/2ML IV SOLN
INTRAVENOUS | Status: DC | PRN
  Administered 2015-12-09: 200 mg via INTRAVENOUS

## 2015-12-09 MED ORDER — LIDOCAINE 5 % EX PTCH
1.0000 | MEDICATED_PATCH | CUTANEOUS | Status: DC
  Filled 2015-12-09 (×2): qty 1

## 2015-12-09 MED ORDER — ROCURONIUM BROMIDE 100 MG/10ML IV SOLN
INTRAVENOUS | Status: DC | PRN
  Administered 2015-12-09: 50 mg via INTRAVENOUS
  Administered 2015-12-09: 10 mg via INTRAVENOUS

## 2015-12-09 MED ORDER — LEVOTHYROXINE SODIUM 25 MCG PO TABS: 125.0000 ug | ORAL_TABLET | Freq: Every day | ORAL | Status: DC

## 2015-12-09 MED ORDER — BISACODYL 10 MG RE SUPP
10.0000 mg | Freq: Every day | RECTAL | Status: DC | PRN
Start: 2015-12-09 — End: 2015-12-10

## 2015-12-09 MED ORDER — LEVOTHYROXINE SODIUM 25 MCG PO TABS
125.0000 ug | ORAL_TABLET | Freq: Every day | ORAL | Status: DC
  Filled 2015-12-09: qty 1

## 2015-12-09 MED ORDER — PHENOL 1.4 % MT LIQD: 1.0000 | OROMUCOSAL | Status: DC | PRN

## 2015-12-09 MED ORDER — HYPROMELLOSE (GONIOSCOPIC) 2.5 % OP SOLN
1.0000 [drp] | Freq: Every day | OPHTHALMIC | Status: DC | PRN
  Filled 2015-12-09: qty 15

## 2015-12-09 MED ORDER — KETOROLAC TROMETHAMINE 15 MG/ML IJ SOLN
INTRAMUSCULAR | Status: AC
  Filled 2015-12-09: qty 1

## 2015-12-09 MED ORDER — SENNA-DOCUSATE SODIUM 8.6-50 MG PO TABS: 2.0000 | ORAL_TABLET | Freq: Every day | ORAL | 1 refills | Status: DC

## 2015-12-09 MED ORDER — LACTATED RINGERS IV SOLN
INTRAVENOUS | Status: DC | PRN
  Administered 2015-12-09: 07:00:00 via INTRAVENOUS

## 2015-12-09 MED ORDER — POTASSIUM CHLORIDE IN NACL 20-0.45 MEQ/L-% IV SOLN
INTRAVENOUS | Status: DC
  Administered 2015-12-09: 17:00:00 via INTRAVENOUS
  Administered 2015-12-10: 75 mL/h via INTRAVENOUS
  Filled 2015-12-09 (×3): qty 1000

## 2015-12-09 MED ORDER — CYCLOBENZAPRINE HCL 10 MG PO TABS
10.0000 mg | ORAL_TABLET | Freq: Three times a day (TID) | ORAL | Status: DC | PRN
  Administered 2015-12-09: 10 mg via ORAL
  Filled 2015-12-09: qty 1

## 2015-12-09 MED ORDER — HYDROMORPHONE HCL 1 MG/ML IJ SOLN: 0.5000 mg | INTRAMUSCULAR | Status: DC | PRN

## 2015-12-09 MED ORDER — DICLOFENAC SODIUM 1 % TD GEL
1.0000 | Freq: Three times a day (TID) | TRANSDERMAL | Status: DC | PRN
Start: 2015-12-09 — End: 2015-12-10
  Filled 2015-12-09: qty 100

## 2015-12-09 MED ORDER — METOCLOPRAMIDE HCL 5 MG/ML IJ SOLN: 5.0000 mg | Freq: Three times a day (TID) | INTRAMUSCULAR | Status: DC | PRN

## 2015-12-09 MED ORDER — ONDANSETRON HCL 4 MG PO TABS: 4.0000 mg | ORAL_TABLET | Freq: Four times a day (QID) | ORAL | Status: DC | PRN

## 2015-12-09 MED ORDER — ALUM & MAG HYDROXIDE-SIMETH 200-200-20 MG/5ML PO SUSP: 30.0000 mL | ORAL | Status: DC | PRN

## 2015-12-09 MED ORDER — OMEGA-3-ACID ETHYL ESTERS 1 G PO CAPS
1.0000 g | ORAL_CAPSULE | ORAL | Status: DC
  Filled 2015-12-09: qty 1

## 2015-12-09 MED ORDER — MEFLOQUINE HCL 250 MG PO TABS: 250.0000 mg | ORAL_TABLET | ORAL | Status: DC

## 2015-12-09 MED ORDER — THERA M PLUS PO TABS: 1.0000 | ORAL_TABLET | Freq: Every day | ORAL | Status: DC

## 2015-12-09 MED ORDER — PRESERVISION AREDS PO CAPS: 1.0000 | ORAL_CAPSULE | Freq: Two times a day (BID) | ORAL | Status: DC

## 2015-12-09 MED ORDER — ACETAMINOPHEN 500 MG PO TABS
1000.0000 mg | ORAL_TABLET | Freq: Four times a day (QID) | ORAL | Status: DC
  Administered 2015-12-10: 1000 mg via ORAL
  Filled 2015-12-09 (×2): qty 2

## 2015-12-09 MED ORDER — MIDAZOLAM HCL 5 MG/5ML IJ SOLN
INTRAMUSCULAR | Status: DC | PRN
  Administered 2015-12-09 (×2): 1 mg via INTRAVENOUS

## 2015-12-09 MED ORDER — METOCLOPRAMIDE HCL 5 MG PO TABS: 5.0000 mg | ORAL_TABLET | Freq: Three times a day (TID) | ORAL | Status: DC | PRN

## 2015-12-09 MED ORDER — PROMETHAZINE HCL 25 MG/ML IJ SOLN
6.2500 mg | Freq: Once | INTRAMUSCULAR | Status: AC
  Administered 2015-12-09: 6.25 mg via INTRAVENOUS

## 2015-12-09 MED ORDER — OXYCODONE HCL 5 MG PO TABS: 2.5000 mg | ORAL_TABLET | ORAL | Status: DC | PRN

## 2015-12-09 MED ORDER — OXYCODONE HCL 5 MG PO TABS: 2.5000 mg | ORAL_TABLET | ORAL | 0 refills | Status: DC | PRN

## 2015-12-09 MED ORDER — ARTIFICIAL TEARS OP OINT
TOPICAL_OINTMENT | OPHTHALMIC | Status: DC | PRN
Start: 2015-12-09 — End: 2015-12-09
  Administered 2015-12-09: 1 via OPHTHALMIC

## 2015-12-09 MED ORDER — CEFAZOLIN SODIUM-DEXTROSE 2-4 GM/100ML-% IV SOLN
2.0000 g | INTRAVENOUS | Status: DC
  Filled 2015-12-09: qty 100

## 2015-12-09 MED ORDER — LIDOCAINE HCL (CARDIAC) 20 MG/ML IV SOLN
INTRAVENOUS | Status: DC | PRN
  Administered 2015-12-09: 80 mg via INTRAVENOUS

## 2015-12-09 MED ORDER — PANTOPRAZOLE SODIUM 40 MG PO TBEC
40.0000 mg | DELAYED_RELEASE_TABLET | Freq: Every day | ORAL | Status: DC
  Filled 2015-12-09 (×2): qty 1

## 2015-12-09 MED ORDER — ACETAMINOPHEN 650 MG RE SUPP: 650.0000 mg | Freq: Four times a day (QID) | RECTAL | Status: DC | PRN

## 2015-12-09 MED ORDER — LORATADINE 10 MG PO TABS
10.0000 mg | ORAL_TABLET | Freq: Every day | ORAL | Status: DC
  Filled 2015-12-09: qty 1

## 2015-12-09 MED ORDER — ATENOLOL 50 MG PO TABS
25.0000 mg | ORAL_TABLET | Freq: Every day | ORAL | Status: DC
  Administered 2015-12-09: 25 mg via ORAL
  Filled 2015-12-09 (×3): qty 1

## 2015-12-09 MED ORDER — LEVOTHYROXINE SODIUM 125 MCG PO CAPS: 125.0000 ug | ORAL_CAPSULE | Freq: Every day | ORAL | Status: DC

## 2015-12-09 MED ORDER — HYDROMORPHONE HCL 1 MG/ML IJ SOLN: 0.2500 mg | INTRAMUSCULAR | Status: DC | PRN

## 2015-12-09 MED ORDER — SODIUM CHLORIDE 0.9 % IR SOLN
Status: DC | PRN
  Administered 2015-12-09: 1000 mL

## 2015-12-09 MED ORDER — KETOROLAC TROMETHAMINE 15 MG/ML IJ SOLN
7.5000 mg | Freq: Four times a day (QID) | INTRAMUSCULAR | Status: AC
  Administered 2015-12-09 – 2015-12-10 (×4): 7.5 mg via INTRAVENOUS
  Filled 2015-12-09 (×3): qty 1

## 2015-12-09 MED ORDER — GLUCOSAMINE 500 MG PO CAPS: 1500.0000 mg | ORAL_CAPSULE | Freq: Every day | ORAL | Status: DC

## 2015-12-09 MED ORDER — PROMETHAZINE HCL 25 MG/ML IJ SOLN
INTRAMUSCULAR | Status: AC
  Filled 2015-12-09: qty 1

## 2015-12-09 MED ORDER — MIDAZOLAM HCL 2 MG/2ML IJ SOLN
INTRAMUSCULAR | Status: AC
  Filled 2015-12-09: qty 2

## 2015-12-09 MED ORDER — DOCUSATE SODIUM 100 MG PO CAPS
100.0000 mg | ORAL_CAPSULE | Freq: Every day | ORAL | Status: DC
  Administered 2015-12-09 – 2015-12-10 (×2): 100 mg via ORAL
  Filled 2015-12-09 (×2): qty 1

## 2015-12-09 SURGICAL SUPPLY — 69 items
BIT DRILL 5/64X5 DISP (BIT) ×2 IMPLANT
BIT DRILL F/CENTRAL SCRW 3.2 (BIT) ×1
BIT DRILL F/CENTRAL SCRW 3.2MM (BIT) ×1 IMPLANT
BIT DRILL TWIST 2.7 (BIT) ×2 IMPLANT
BLADE SAW SGTL MED 73X18.5 STR (BLADE) ×2 IMPLANT
CAPT SHLDR REVTOTAL 2 ×2 IMPLANT
CLSR STERI-STRIP ANTIMIC 1/2X4 (GAUZE/BANDAGES/DRESSINGS) ×2 IMPLANT
COVER SURGICAL LIGHT HANDLE (MISCELLANEOUS) ×2 IMPLANT
DRAPE ORTHO SPLIT 77X108 STRL (DRAPES) ×2
DRAPE PROXIMA HALF (DRAPES) ×2 IMPLANT
DRAPE SURG 17X23 STRL (DRAPES) ×2 IMPLANT
DRAPE SURG ORHT 6 SPLT 77X108 (DRAPES) ×2 IMPLANT
DRAPE U-SHAPE 47X51 STRL (DRAPES) ×2 IMPLANT
DRILL BIT F/CENTRAL SCRW 3.2MM (BIT) ×1
DRSG MEPILEX BORDER 4X8 (GAUZE/BANDAGES/DRESSINGS) ×2 IMPLANT
DURAPREP 26ML APPLICATOR (WOUND CARE) ×2 IMPLANT
ELECT REM PT RETURN 9FT ADLT (ELECTROSURGICAL) ×2
ELECTRODE REM PT RTRN 9FT ADLT (ELECTROSURGICAL) ×1 IMPLANT
GLOVE BIOGEL PI IND STRL 7.0 (GLOVE) ×3 IMPLANT
GLOVE BIOGEL PI IND STRL 8 (GLOVE) ×1 IMPLANT
GLOVE BIOGEL PI INDICATOR 7.0 (GLOVE) ×3
GLOVE BIOGEL PI INDICATOR 8 (GLOVE) ×1
GLOVE BIOGEL PI ORTHO PRO SZ8 (GLOVE) ×1
GLOVE ORTHO TXT STRL SZ7.5 (GLOVE) ×2 IMPLANT
GLOVE PI ORTHO PRO STRL SZ8 (GLOVE) ×1 IMPLANT
GLOVE SURG ORTHO 8.0 STRL STRW (GLOVE) ×4 IMPLANT
GLOVE SURG SS PI 6.5 STRL IVOR (GLOVE) ×2 IMPLANT
GOWN STRL REUS W/ TWL LRG LVL3 (GOWN DISPOSABLE) ×1 IMPLANT
GOWN STRL REUS W/ TWL XL LVL3 (GOWN DISPOSABLE) ×1 IMPLANT
GOWN STRL REUS W/TWL 2XL LVL3 (GOWN DISPOSABLE) ×2 IMPLANT
GOWN STRL REUS W/TWL LRG LVL3 (GOWN DISPOSABLE) ×1
GOWN STRL REUS W/TWL XL LVL3 (GOWN DISPOSABLE) ×1
HANDPIECE INTERPULSE COAX TIP (DISPOSABLE) ×1
HOOD PEEL AWAY FACE SHEILD DIS (HOOD) ×4 IMPLANT
KIT BASIN OR (CUSTOM PROCEDURE TRAY) ×2 IMPLANT
KIT ROOM TURNOVER OR (KITS) ×2 IMPLANT
MANIFOLD NEPTUNE II (INSTRUMENTS) ×2 IMPLANT
NEEDLE 1/2 CIR CATGUT .05X1.09 (NEEDLE) IMPLANT
NEEDLE HYPO 25GX1X1/2 BEV (NEEDLE) IMPLANT
NS IRRIG 1000ML POUR BTL (IV SOLUTION) ×2 IMPLANT
PACK SHOULDER (CUSTOM PROCEDURE TRAY) ×2 IMPLANT
PAD ARMBOARD 7.5X6 YLW CONV (MISCELLANEOUS) ×4 IMPLANT
PIN HUMERAL STMN 3.2MMX9IN (INSTRUMENTS) ×2 IMPLANT
PIN STEINMANN THREADED TIP (PIN) ×2 IMPLANT
PIN THREADED REVERSE (PIN) ×2 IMPLANT
SET HNDPC FAN SPRY TIP SCT (DISPOSABLE) ×1 IMPLANT
SLING ARM IMMOBILIZER LRG (SOFTGOODS) ×4 IMPLANT
SLING ARM IMMOBILIZER MED (SOFTGOODS) IMPLANT
SMARTMIX MINI TOWER (MISCELLANEOUS)
SPONGE LAP 18X18 X RAY DECT (DISPOSABLE) ×4 IMPLANT
SUCTION FRAZIER HANDLE 10FR (MISCELLANEOUS) ×1
SUCTION TUBE FRAZIER 10FR DISP (MISCELLANEOUS) ×1 IMPLANT
SUPPORT WRAP ARM LG (MISCELLANEOUS) ×2 IMPLANT
SUT FIBERWIRE #2 38 REV NDL BL (SUTURE) ×10
SUT MAXBRAID (SUTURE) IMPLANT
SUT MNCRL AB 4-0 PS2 18 (SUTURE) IMPLANT
SUT VIC AB 0 CT1 27 (SUTURE) ×1
SUT VIC AB 0 CT1 27XBRD ANBCTR (SUTURE) ×1 IMPLANT
SUT VIC AB 2-0 CT1 27 (SUTURE)
SUT VIC AB 2-0 CT1 TAPERPNT 27 (SUTURE) IMPLANT
SUT VIC AB 3-0 SH 8-18 (SUTURE) ×2 IMPLANT
SUTURE FIBERWR#2 38 REV NDL BL (SUTURE) ×5 IMPLANT
SYR CONTROL 10ML LL (SYRINGE) IMPLANT
TOWEL OR 17X24 6PK STRL BLUE (TOWEL DISPOSABLE) ×2 IMPLANT
TOWEL OR 17X26 10 PK STRL BLUE (TOWEL DISPOSABLE) ×2 IMPLANT
TOWER SMARTMIX MINI (MISCELLANEOUS) IMPLANT
TUBE CONNECTING 12X1/4 (SUCTIONS) ×2 IMPLANT
WATER STERILE IRR 1000ML POUR (IV SOLUTION) ×2 IMPLANT
YANKAUER SUCT BULB TIP NO VENT (SUCTIONS) ×2 IMPLANT

## 2015-12-09 NOTE — Transfer of Care (Signed)
Immediate Anesthesia Transfer of Care Note  Patient: Patricia [name]  Procedure(s) Performed: Procedure(s): REVERSE TOTAL SHOULDER ARTHROPLASTY (Left)  Patient Location: PACU  Anesthesia Type:General  Level of Consciousness: awake, alert , oriented and patient cooperative  Airway & Oxygen Therapy: Patient Spontanous Breathing and Patient connected to face mask oxygen  Post-op Assessment: Report given to RN and Post -op Vital signs reviewed and stable  Post vital signs: stable reviewed  Last Vitals:  Vitals:   12/09/15 0608  BP: (!) 153/78  Pulse: 76  Resp: 20  Temp: 36.5 C    Last Pain:  Vitals:   12/09/15 1026  TempSrc:   PainSc: 0-No pain         Complications: No apparent anesthesia complications

## 2015-12-09 NOTE — Anesthesia Procedure Notes (Signed)
Procedure Name: Intubation Date/Time: 12/09/2015 7:46 AM Performed by: Oletta Lamas Pre-anesthesia Checklist: Patient identified, Emergency Drugs available, Suction available and Patient being monitored Patient Re-evaluated:Patient Re-evaluated prior to inductionOxygen Delivery Method: Circle System Utilized Preoxygenation: Pre-oxygenation with 100% oxygen Intubation Type: IV induction Ventilation: Mask ventilation without difficulty Laryngoscope Size: Mac and 3 Grade View: Grade III Tube type: Oral Tube size: 7.0 mm Number of attempts: 2 Airway Equipment and Method: Stylet Placement Confirmation: ETT inserted through vocal cords under direct vision,  positive ETCO2 and breath sounds checked- equal and bilateral Secured at: 23 cm Tube secured with: Tape Dental Injury: Teeth and Oropharynx as per pre-operative assessment  Comments: Did not attempt to pass tube on initial laryngoscopy due to poor view.  Dr. Orene Desanctis performed a blind pass intubation on first attempt.

## 2015-12-09 NOTE — Anesthesia Postprocedure Evaluation (Signed)
Anesthesia Post Note  Patient: Patricia [name]  Procedure(s) Performed: Procedure(s) (LRB): REVERSE TOTAL SHOULDER ARTHROPLASTY (Left)  Patient location during evaluation: PACU Anesthesia Type: General and Regional Level of consciousness: awake and alert Pain management: pain level controlled Vital Signs Assessment: post-procedure vital signs reviewed and stable Respiratory status: spontaneous breathing, nonlabored ventilation, respiratory function stable and patient connected to nasal cannula oxygen Cardiovascular status: blood pressure returned to baseline and stable Postop Assessment: no signs of nausea or vomiting Anesthetic complications: no    Last Vitals:  Vitals:   12/09/15 1113 12/09/15 1126  BP:  (!) 145/87  Pulse: 75 74  Resp: 18 17  Temp:      Last Pain:  Vitals:   12/09/15 1126  TempSrc:   PainSc: 0-No pain                 Shaylen Nephew,W. EDMOND

## 2015-12-09 NOTE — Progress Notes (Signed)
Orthopedic Tech Progress Note Patient Details:  Patricia [name] June 14, 1934 [id number]  Ortho Devices Type of Ortho Device: Sling immobilizer   [place] Pink 12/09/2015, 3:46 PM

## 2015-12-09 NOTE — Op Note (Signed)
12/09/2015  10:11 AM  PATIENT:  Patricia [name]    PRE-OPERATIVE DIAGNOSIS:  Left shoulder rotator cuff arthropathy, with retained biceps tenodesis screw  POST-OPERATIVE DIAGNOSIS:  Same  PROCEDURE:  Left REVERSE TOTAL SHOULDER ARTHROPLASTY, with removal of hardware, deep  SURGEON:  Johnny Bridge, MD  PHYSICIAN ASSISTANT: Joya Gaskins, OPA-C, present and scrubbed throughout the case, critical for completion in a timely fashion, and for retraction, instrumentation, and closure.  ANESTHESIA:   General  PREOPERATIVE INDICATIONS:  Patricia [name] is a  81 y.o. female with a diagnosis of left shoulder rotator cuff arthropathy who failed conservative measures and elected for surgical management.  She's had previous surgical debridement, as well as a biceps tenodesis, and failed conservative measures.  The risks benefits and alternatives were discussed with the patient preoperatively including but not limited to the risks of infection, bleeding, nerve injury, cardiopulmonary complications, the need for revision surgery, dislocation, brachial plexus palsy, incomplete relief of pain, among others, and the patient was willing to proceed.  OPERATIVE IMPLANTS: Biomet size 10 humeral stem press-fit standard with a 44 mm reverse shoulder arthroplasty tray with a standard liner and a 36 mm glenosphere with a mini baseplate and 3 locking screws and one central nonlocking screw.  OPERATIVE FINDINGS: The shoulder was extremely stiff, with loss of motion in all planes. There was substantial subacromial adhesions with almost no glenohumeral mobility. The humeral size was between a 9 and a 10, and I had to remove the bicipital tenodesis screw in order to adequately ream. The shoulder was very tight, and I had to resect the humeral head twice in order to have appropriate length, and minimize tension on the brachial plexus. The conjoined tendon did not have substantial tension after all the implants were in. Reduction  however was somewhat challenging. I did have reasonably good subscapularis to repair. There was really no infraspinatus to speak of.  OPERATIVE PROCEDURE: The patient was brought to the operating room and placed in the supine position. General anesthesia was administered. IV antibiotics were given. Time out was performed. The upper extremity was prepped and draped in usual sterile fashion. The patient was in a beachchair position. Deltopectoral approach was carried out. I released the biceps, which was not substantial enough to achieve a tenodesis. The subscapularis was released off of the bone.   I then performed circumferential releases of the humerus, and then dislocated the head, and then reamed with the reamer to the above named size. I had to stop reaming at a size 8, and cut the humeral head, and then remove the biceps tenodesis screw from within the medullary canal, and then go back and continue reaming up to a size 10.  During my cut, I applied the jig, and cut the humeral head in 30 of retroversion, and then turned my attention to the glenoid.  Deep retractors were placed, and I resected the labrum, and then placed a guidepin into the center position on the glenoid, with slight inferior inclination. I then reamed over the guidepin, and this created a small metaphyseal cancellus blush inferiorly, removing just the cartilage to the subchondral bone superiorly. The base plate was selected and impacted place, and then I secured it centrally with a nonlocking screw, and I had excellent purchase both inferiorly and superiorly. I placed a short locking screws anteriorly. I had the center of the baseplate completely contained within the vault and excellent central compression with the nonlocking screw.  I then turned my attention to the glenosphere,  and impacted this into place, placing slight inferior offset (set on B).   The glenosphere was completely seated, and had engagement of the [facility] taper. I  then turned my attention back to the humerus.  I sequentially broached, and then trialed, and was found to restore soft tissue tension, and it had 2 finger tightness. Initially, I could not reduce it, and so I went back and performed a second cut on the femoral neck. I was subsequently able to re-ream with the size 10. The stem was between a size 9 and a size 10, but the size 9 didn't really provide adequate press-fit grip, so I worked to get the tendon to fit, but was ultimately satisfied with the fit although it sat slightly proud. I was concerned about fracturing the humerus and so I did not get any more aggressive with delivery of the stem.  The above named components were selected. The shoulder felt stable throughout functional motion.  Before I placed the real prosthesis I had also placed a total of 3 #2 FiberWire through drill holes in the humerus for later subscapularis repair.  I then impacted the real prosthesis into place, as well as the real humeral tray, and reduced the shoulder. The shoulder had excellent motion, and was stable, and I irrigated the wounds copiously.    I then used these to repair the subscapularis. This came down to bone.  I then irrigated the shoulder copiously once more, repaired the deltopectoral interval with Vicryl followed by subcutaneous Vicryl with Steri-Strips and sterile gauze for the skin. The patient was awakened and returned back in stable and satisfactory condition. There no complications and She tolerated the procedure well.

## 2015-12-09 NOTE — Anesthesia Procedure Notes (Addendum)
Anesthesia Regional Block:  Interscalene brachial plexus block  Pre-Anesthetic Checklist: ,, timeout performed, Correct Patient, Correct Site, Correct Laterality, Correct Procedure, Correct Position, site marked, Risks and benefits discussed, pre-op evaluation,  At surgeon's request and post-op pain management  Laterality: Left  Prep: Maximum Sterile Barrier Precautions used, chloraprep       Needles:  Injection technique: Single-shot  Needle Type: Echogenic Stimulator Needle     Needle Length: 5cm 5 cm Needle Gauge: 22 and 22 G    Additional Needles:  Procedures: ultrasound guided (picture in chart) and nerve stimulator Interscalene brachial plexus block  Nerve Stimulator or Paresthesia:  Response: Biceps response,   Additional Responses:   Narrative:  Start time: 12/09/2015 7:05 AM End time: 12/09/2015 7:15 AM Injection made incrementally with aspirations every 5 mL. Anesthesiologist: Roderic Palau  Additional Notes: 2% Lidocaine skin wheel.

## 2015-12-09 NOTE — H&P (Signed)
PREOPERATIVE H&P  Chief Complaint: left shoulder rotator cuff arthropathy  HPI: Patricia [name] is a 81 y.o. female who presents for preoperative history and physical with a diagnosis of left shoulder rotator cuff arthropathy. Symptoms are rated as moderate to severe, and have been worsening.  This is significantly impairing activities of daily living.  She has elected for surgical management.   She has failed injections, activity modification, anti-inflammatories, and assistive devices.  Preoperative X-rays demonstrate end stage degenerative changes with osteophyte formation, loss of joint space, subchondral sclerosis.  She has had a previous contralateral reverse rotator cuff arthroplasty, and did well, and has elected for procedure on the left side.  Past Medical History:  Diagnosis Date  . Arthritis   . Complication of anesthesia    nausea from fentayl; hypotension episode after previous shoulder surgery  . Degenerative arthritis of right shoulder region 03/06/2011  . GERD (gastroesophageal reflux disease)   . Glaucoma   . History of bronchitis   . History of kidney stones   . History of pneumonia   . Hypertension   . Iron deficiency anemia   . Macular degeneration disease   . Numbness    left foot and toes  . Rotator cuff tear, right 03/06/2011  . Spinal stenosis   . Spinal stenosis    Past Surgical History:  Procedure Laterality Date  . ABDOMINAL HYSTERECTOMY    . CARPAL TUNNEL RELEASE    . DILATION AND CURETTAGE OF UTERUS    . EYE SURGERY Bilateral    cataract  . JOINT REPLACEMENT     lt knee-05  . KNEE SURGERY    . REVERSE SHOULDER ARTHROPLASTY  03/09/2011   Procedure: REVERSE SHOULDER ARTHROPLASTY;  Surgeon: Johnny Bridge;  Location: [facility];  Service: Orthopedics;  Laterality: Right;  . ROTATOR CUFF REPAIR, left     Social History   Social History  . Marital status: Widowed    Spouse name: N/A  . Number of children: N/A  . Years of  education: N/A   Social History Main Topics  . Smoking status: Never Smoker  . Smokeless tobacco: Never Used  . Alcohol use No  . Drug use: No  . Sexual activity: Not on file   Other Topics Concern  . Not on file   Social History Narrative  . No narrative on file   No family history on file. Allergies  Allergen Reactions  . Codeine Nausea And Vomiting  . Fentanyl And Related Nausea And Vomiting  . Meclizine Nausea And Vomiting  . Tramadol Nausea And Vomiting  . Sulfa Antibiotics Nausea And Vomiting and Rash   Prior to Admission medications   Medication Sig Start Date End Date Taking? Authorizing Provider  acetaminophen (TYLENOL) 650 MG CR tablet Take 650 mg by mouth 2 (two) times daily as needed for pain.   Yes Historical Provider, MD  aspirin EC 81 MG tablet Take 81 mg by mouth daily.     Yes Historical Provider, MD  atenolol (TENORMIN) 25 MG tablet Take 25 mg by mouth daily.     Yes Historical Provider, MD  cyclobenzaprine (FLEXERIL) 10 MG tablet Take 10 mg by mouth at bedtime as needed. Takes 10-20mg     Yes Historical Provider, MD  docusate sodium (COLACE) 100 MG capsule Take 100 mg by mouth daily.   Yes Historical Provider, MD  fexofenadine (ALLEGRA) 60 MG tablet Take 120 mg by mouth daily. For allergies   Yes Historical Provider, MD  fish  oil-omega-3 fatty acids 1000 MG capsule Take 1 g by mouth every other day.    Yes Historical Provider, MD  GLUCOSAMINE PO Take 1,500 mg by mouth daily. With MSM Hold while in hospital   Yes Historical Provider, MD  hydroxypropyl methylcellulose (ISOPTO TEARS) 2.5 % ophthalmic solution Place 1 drop into both eyes daily as needed for dry eyes.    Yes Historical Provider, MD  Levothyroxine Sodium 125 MCG CAPS Take 125 mcg by mouth daily. BRAND ONLY   Yes Historical Provider, MD  lidocaine (LIDODERM) 5 % Place 1 patch onto the skin daily. Remove & Discard patch within 12 hours or as directed by MD   Yes Historical Provider, MD  Lysine 1000 MG  TABS Take 1,000 mg by mouth daily.     Yes Historical Provider, MD  mefloquine (LARIAM) 250 MG tablet Take 250 mg by mouth once a week.    Yes Historical Provider, MD  Multiple Vitamins-Minerals (MULTIVITAMINS THER. W/MINERALS) TABS Take 1 tablet by mouth daily.     Yes Historical Provider, MD  Multiple Vitamins-Minerals (PRESERVISION AREDS) CAPS Take 1 capsule by mouth 2 (two) times daily.     Yes Historical Provider, MD  pantoprazole (PROTONIX) 40 MG tablet Take 40 mg by mouth daily.     Yes Historical Provider, MD  VITAMIN D, CHOLECALCIFEROL, PO Take 400 Units by mouth daily.     Yes Historical Provider, MD  zolpidem (AMBIEN) 5 MG tablet Take 5 mg by mouth at bedtime as needed for sleep.    Yes Historical Provider, MD  diclofenac sodium (VOLTAREN) 1 % GEL Apply 1 application topically 3 (three) times daily as needed (pain).     Historical Provider, MD     Positive ROS: All other systems have been reviewed and were otherwise negative with the exception of those mentioned in the HPI and as above.  Physical Exam: General: Alert, no acute distress Cardiovascular: No pedal edema Respiratory: No cyanosis, no use of accessory musculature GI: No organomegaly, abdomen is soft and non-tender Skin: No lesions in the area of chief complaint Neurologic: Sensation intact distally Psychiatric: Patient is competent for consent with normal mood and affect Lymphatic: No axillary or cervical lymphadenopathy  MUSCULOSKELETAL: Left shoulder active motion is 0-80, external rotation strength is severely limited, both actively and passively. Positive crepitance.  Assessment: Left shoulder rotator cuff arthropathy  Plan: Plan for Procedure(s): Left reverse total shoulder replacement  The risks benefits and alternatives were discussed with the patient including but not limited to the risks of nonoperative treatment, versus surgical intervention including infection, bleeding, nerve injury,  blood clots,  cardiopulmonary complications, morbidity, mortality, among others, and they were willing to proceed. We also discussed the potential for acromial fracture, incomplete return of external rotation strength, persistent dysfunction, among others.  Johnny Bridge, MD Cell [phone]   12/09/2015 7:10 AM

## 2015-12-09 NOTE — Discharge Instructions (Signed)

## 2015-12-10 ENCOUNTER — Encounter (HOSPITAL_COMMUNITY): Payer: Self-pay | Admitting: Orthopedic Surgery

## 2015-12-10 LAB — BASIC METABOLIC PANEL
ANION GAP: 6 (ref 5–15)
BUN: 21 mg/dL — ABNORMAL HIGH (ref 6–20)
CO2: 25 mmol/L (ref 22–32)
Calcium: 8.2 mg/dL — ABNORMAL LOW (ref 8.9–10.3)
Chloride: 103 mmol/L (ref 101–111)
Creatinine, Ser: 0.96 mg/dL (ref 0.44–1.00)
GFR, EST NON AFRICAN AMERICAN: 55 mL/min — AB (ref >60)
Glucose, Bld: 107 mg/dL — ABNORMAL HIGH (ref 65–99)
POTASSIUM: 4.7 mmol/L (ref 3.5–5.1)
SODIUM: 134 mmol/L — AB (ref 135–145)

## 2015-12-10 LAB — CBC
HCT: 28.4 % — ABNORMAL LOW (ref 36.0–46.0)
Hemoglobin: 9 g/dL — ABNORMAL LOW (ref 12.0–15.0)
MCH: 29.2 pg (ref 26.0–34.0)
MCHC: 31.7 g/dL (ref 30.0–36.0)
MCV: 92.2 fL (ref 78.0–100.0)
PLATELETS: 237 10*3/uL (ref 150–400)
RBC: 3.08 MIL/uL — AB (ref 3.87–5.11)
RDW: 13.9 % (ref 11.5–15.5)
WBC: 11.7 10*3/uL — AB (ref 4.0–10.5)

## 2015-12-10 MED ORDER — KETOROLAC TROMETHAMINE 10 MG PO TABS: 10.0000 mg | ORAL_TABLET | Freq: Three times a day (TID) | ORAL | 0 refills | Status: DC

## 2015-12-10 MED ORDER — HYDROMORPHONE HCL 2 MG PO TABS
1.0000 mg | ORAL_TABLET | ORAL | Status: DC | PRN
Start: 2015-12-10 — End: 2015-12-10
  Administered 2015-12-10: 2 mg via ORAL
  Filled 2015-12-10: qty 1

## 2015-12-10 MED ORDER — KETOROLAC TROMETHAMINE 10 MG PO TABS
10.0000 mg | ORAL_TABLET | Freq: Three times a day (TID) | ORAL | Status: DC
  Administered 2015-12-10: 10 mg via ORAL
  Filled 2015-12-10 (×3): qty 1

## 2015-12-10 MED ORDER — HYDROMORPHONE HCL 2 MG PO TABS: 2.0000 mg | ORAL_TABLET | ORAL | 0 refills | Status: DC | PRN

## 2015-12-10 NOTE — Evaluation (Addendum)
Physical Therapy Evaluation Patient Details Name: Patricia [name] MRN: [id number] DOB: 1934/07/12 Today's Date: 12/10/2015   History of Present Illness  s/p L reverse TSA PMH: R reverse TSA, TKA, HTN, spinal stenosis, macular degeneration.  Clinical Impression  Pt presented sitting OOB in recliner when PT entered room. Prior to admission, pt was independent with all ADL's and functional mobility. 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 Pt moved well during evaluation and required supervision for ambulation and functional mobility. Pt declined stair training at this time and stated that she feels confident in her ability to safely ascend and descend the stairs when she d/c's. PT answered all of pt's questions. No further PT services needed acutely or upon d/c.     Follow Up Recommendations No PT follow up;Supervision - Intermittent    Equipment Recommendations  None recommended by PT    Recommendations for Other Services       Precautions / Restrictions Precautions Precautions: Shoulder Type of Shoulder Precautions: conservative protocol Shoulder Interventions: Shoulder sling/immobilizer;Off for dressing/bathing/exercises Precaution Booklet Issued: Yes (comment) Required Braces or Orthoses: Sling Restrictions Weight Bearing Restrictions: Yes LLE Weight Bearing: Non weight bearing      Mobility  Bed Mobility               General bed mobility comments: pt sitting OOB in recliner when PT entered room  Transfers Overall transfer level: Needs assistance Equipment used: None Transfers: Sit to/from Stand Sit to Stand: Supervision         General transfer comment: pt required increased time and use of R UE   Ambulation/Gait Ambulation/Gait assistance: Supervision Ambulation Distance (Feet): 50 Feet Assistive device: None Gait Pattern/deviations: Step-through pattern Gait velocity: decreased      Stairs             Wheelchair Mobility    Modified Rankin (Stroke Patients Only)       Balance Overall balance assessment: Needs assistance Sitting-balance support: Feet supported;No upper extremity supported Sitting balance-Leahy Scale: Good     Standing balance support: During functional activity;No upper extremity supported Standing balance-Leahy Scale: Fair                               Pertinent Vitals/Pain Pain Assessment: 0-10 Pain Score: 2  Pain Location: L shoulder Pain Descriptors / Indicators: Sore;Guarding;Grimacing Pain Intervention(s): Monitored during session;Repositioned    Home Living Family/patient expects to be discharged to:: Private residence Living Arrangements: Children Available Help at Discharge: Family;Available PRN/intermittently Type of Home: House Home Access: Stairs to enter Entrance Stairs-Rails: Can reach both Entrance Stairs-Number of Steps: 2 Home Layout: One level Home Equipment: Grab bars - tub/shower;Walker - 2 wheels;Shower seat;Cane - single point      Prior Function Level of Independence: Independent         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[place]     Hand Dominance   Dominant Hand: Right    Extremity/Trunk Assessment   Upper Extremity Assessment: Defer to OT evaluation       Lower Extremity Assessment: Overall WFL for tasks assessed         Communication   Communication: No difficulties  Cognition Arousal/Alertness: Awake/alert Behavior During Therapy: WFL for tasks assessed/performed Overall Cognitive Status: Within Functional Limits for tasks assessed  General Comments      Exercises        Assessment/Plan    PT Assessment Patent does not need any further PT services  PT Diagnosis Difficulty walking;Acute pain   PT Problem List    PT Treatment Interventions     PT Goals (Current goals can be found in the Care Plan section) Acute Rehab PT Goals Patient Stated Goal:  return to her home in [place]    Frequency     Barriers to discharge        Co-evaluation               End of Session Equipment Utilized During Treatment: Gait belt Activity Tolerance: Patient tolerated treatment well Patient left: in chair;with call bell/phone within reach Nurse Communication: Mobility status         Time: 1004-1017 PT Time Calculation (min) (ACUTE ONLY): 13 min   Charges:   PT Evaluation $PT Eval Moderate Complexity: 1 Procedure     PT G CodesClearnce Sorrel Nairi Oswald 12/10/2015, 11:03 AM Sherie Don, PT, DPT [phone]

## 2015-12-10 NOTE — Progress Notes (Signed)
Patient discharged to home, discharge instructions given, patient stated she understood 

## 2015-12-10 NOTE — Evaluation (Signed)
Occupational Therapy Evaluation Patient Details Name: Patricia [name] MRN: [id number] DOB: 11/10/1934 Today's Date: 12/10/2015    History of Present Illness s/p L reverse TSA PMH: R reverse TSA, TKA, HTN, spinal stenosis, macular degeneration.   Clinical Impression   Pt was independent prior to admission. Reports hx of falls. Pt educated in positioning L UE in sling, chair and bed, NWB status and compensatory strategies for ADL. Tolerated AROM L elbow to hand well. Pt presents with generalized weakness and impaired balance warranting physical therapy in addition to OT. Will follow acutely. Recommending HHOT as pt has only intermittent assistance as her family works.    Follow Up Recommendations  Home health OT    Equipment Recommendations  None recommended by OT    Recommendations for Other Services       Precautions / Restrictions Precautions Precautions: Shoulder Type of Shoulder Precautions: conservative protocol Shoulder Interventions: Shoulder sling/immobilizer;Off for dressing/bathing/exercises Precaution Booklet Issued: Yes (comment) Required Braces or Orthoses: Sling Restrictions Weight Bearing Restrictions: Yes LLE Weight Bearing: Non weight bearing      Mobility Bed Mobility               General bed mobility comments: pt in chair, recommended getting up to R side to avoid pushing up on L UE  Transfers Overall transfer level: Needs assistance   Transfers: Sit to/from Stand Sit to Stand: Supervision         General transfer comment: increased time and use of R UE     Balance Overall balance assessment: Needs assistance Sitting-balance support: Feet supported Sitting balance-Leahy Scale: Good       Standing balance-Leahy Scale: Fair                              ADL Overall ADL's : Needs assistance/impaired Eating/Feeding: Modified independent;Sitting Eating/Feeding Details (indicate cue type and reason): opening containers with  teeth Grooming: Wash/dry hands;Wash/dry face;Standing;Supervision/safety   Upper Body Bathing: Minimal assitance;Sitting   Lower Body Bathing: Sit to/from stand;Supervison/ safety;Set up   Upper Body Dressing : Minimal assistance;Sitting Upper Body Dressing Details (indicate cue type and reason): educated in compensatory strategies and sling donning and doffing Lower Body Dressing: Supervision/safety;Sit to/from stand Lower Body Dressing Details (indicate cue type and reason): recommended slip on shoes and elastic waist pants Toilet Transfer: Supervision/safety;Ambulation;Comfort height toilet   Toileting- Clothing Manipulation and Hygiene: Supervision/safety;Sit to/from stand       Functional mobility during ADLs: Supervision/safety General ADL Comments: Pt's daughter has fabricated velcro sleeved shirts for pt. Instructed in positioning L UE in bed and chair.     Vision     Perception     Praxis      Pertinent Vitals/Pain Pain Assessment: 0-10 Pain Score: 4  Pain Location: L shoulder Pain Descriptors / Indicators: Sore;Guarding;Grimacing Pain Intervention(s): Monitored during session;Premedicated before session;Repositioned;Ice applied     Hand Dominance Right   Extremity/Trunk Assessment Upper Extremity Assessment Upper Extremity Assessment: LUE deficits/detail;RUE deficits/detail RUE Deficits / Details: generalized weakness, hx of reverse TSA in 2012 LUE Deficits / Details: performed AROM x 10 elbow to hand in standing LUE: Unable to fully assess due to immobilization LUE Coordination: decreased gross motor   Lower Extremity Assessment Lower Extremity Assessment: Defer to PT evaluation       Communication Communication Communication: No difficulties   Cognition Arousal/Alertness: Awake/alert Behavior During Therapy: WFL for tasks assessed/performed Overall Cognitive Status: Within Functional Limits for tasks assessed  General  Comments       Exercises       Shoulder Instructions      Home Living Family/patient expects to be discharged to:: Private residence Living Arrangements: Children Available Help at Discharge: Family;Available PRN/intermittently Type of Home: House Home Access: Stairs to enter [employer] of Steps: 2 Entrance Stairs-Rails: Can reach both Home Layout: One level     Bathroom Shower/Tub: Occupational psychologist: Standard     Home Equipment: Grab bars - tub/shower;Walker - 2 wheels;Shower seat;Cane - single point          Prior Functioning/Environment Level of Independence: Independent        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[place]    OT Diagnosis: Generalized weakness;Acute pain   OT Problem List: Decreased range of motion;Impaired balance (sitting and/or standing);Decreased knowledge of precautions;Impaired UE functional use;Pain;Decreased coordination   OT Treatment/Interventions: Self-care/ADL training;Therapeutic activities;Patient/family education;Therapeutic exercise    OT Goals(Current goals can be found in the care plan section) Acute Rehab OT Goals Patient Stated Goal: return to her home in [place] OT Goal Formulation: With patient Time For Goal Achievement: 12/17/15 Potential to Achieve Goals: Good ADL Goals Pt Will Perform Upper Body Bathing: with modified independence;standing Pt Will Perform Upper Body Dressing: with modified independence;standing Pt Will Transfer to Toilet: with modified independence;ambulating;regular height toilet Pt Will Perform Toileting - Clothing Manipulation and hygiene: with modified independence;sitting/lateral leans Pt/caregiver will Perform Home Exercise Program: Independently (AROM L elbow to hand) Additional ADL Goal #1: Pt and caregiver will be independent in donning and doffing L UE sling.  OT Frequency: Min 2X/week   Barriers to D/C:            Co-evaluation              End of Session  Equipment Utilized During Treatment: Gait belt (sling)  Activity Tolerance: Patient tolerated treatment well Patient left: in chair;with call bell/phone within reach   Time: 0855-0928 OT Time Calculation (min): 33 min Charges:  OT General Charges $OT Visit: 1 Procedure OT Evaluation $OT Eval Moderate Complexity: 1 Procedure OT Treatments $Self Care/Home Management : 8-22 mins G-Codes:    Patricia [name] 12/10/2015, 9:51 AM  [phone]

## 2015-12-10 NOTE — Discharge Summary (Addendum)
Physician Discharge Summary  Patient ID: Patricia [name] MRN: [id number] DOB/AGE: [birth year] 81 y.o.  Admit date: 12/09/2015 Discharge date: 12/10/2015  Admission Diagnoses:  Left rotator cuff tear arthropathy  Discharge Diagnoses:  Principal Problem:   Left rotator cuff tear arthropathy Active Problems:   S/P shoulder replacement   Past Medical History:  Diagnosis Date  . Arthritis   . Complication of anesthesia    nausea from fentayl; hypotension episode after previous shoulder surgery  . Degenerative arthritis of right shoulder region 03/06/2011  . GERD (gastroesophageal reflux disease)   . Glaucoma   . History of bronchitis   . History of kidney stones   . History of pneumonia   . Hypertension   . Iron deficiency anemia   . Left rotator cuff tear arthropathy 12/09/2015  . Macular degeneration disease   . Numbness    left foot and toes  . Rotator cuff tear, right 03/06/2011  . Spinal stenosis   . Spinal stenosis     Surgeries: Procedure(s): REVERSE TOTAL SHOULDER ARTHROPLASTY on 12/09/2015   Consultants (if any):   Discharged Condition: Improved  Hospital Course: Patricia [name] is an 81 y.o. female who was admitted 12/09/2015 with a diagnosis of Left rotator cuff tear arthropathy and went to the operating room on 12/09/2015 and underwent the above named procedures.    She was given perioperative antibiotics:  Anti-infectives    Start     Dose/Rate Route Frequency Ordered Stop   12/09/15 1600  ceFAZolin (ANCEF) IVPB 1 g/50 mL premix     1 g 100 mL/hr over 30 Minutes Intravenous Every 6 hours 12/09/15 1503 12/10/15 0615   12/09/15 1515  mefloquine (LARIAM) tablet 250 mg  Status:  Discontinued     250 mg Oral Weekly 12/09/15 1503 12/09/15 1516   12/09/15 0522  ceFAZolin (ANCEF) IVPB 2g/100 mL premix  Status:  Discontinued     2 g 200 mL/hr over 30 Minutes Intravenous On call to O.R. 12/09/15 0522 12/09/15 1457    .  She was given sequential compression devices, early  ambulation for DVT prophylaxis.  She benefited maximally from the hospital stay and there were no complications.    She doesn't tolerate medications for pain much at all, and has a history of mild renal insufficiency with NSAIDs.  Ultimately we decided to try to combine a brief course of toradol with dilaudid, recognizing the risks for NSAID use, given her severe intolerance of other medications.    Recent vital signs:  Vitals:   12/09/15 1509 12/09/15 2012  BP: 134/80 (!) 109/51  Pulse: 80 94  Resp: 18 20  Temp: 97.8 F (36.6 C) 98 F (36.7 C)    Recent laboratory studies:  Lab Results  Component Value Date   HGB 9.0 (L) 12/10/2015   HGB 11.1 (L) 11/28/2015   HGB 11.9 06/01/2011   Lab Results  Component Value Date   WBC 11.7 (H) 12/10/2015   PLT 237 12/10/2015   Lab Results  Component Value Date   INR 1.02 03/05/2011   Lab Results  Component Value Date   NA 134 (L) 12/10/2015   K 4.7 12/10/2015   CL 103 12/10/2015   CO2 25 12/10/2015   BUN 21 (H) 12/10/2015   CREATININE 0.96 12/10/2015   GLUCOSE 107 (H) 12/10/2015    Discharge Medications:     Medication List    TAKE these medications   acetaminophen 650 MG CR tablet Commonly known as:  TYLENOL Take 650 mg by  mouth 2 (two) times daily as needed for pain.   AMBIEN 5 MG tablet Generic drug:  zolpidem Take 5 mg by mouth at bedtime as needed for sleep.   aspirin EC 81 MG tablet Take 81 mg by mouth daily.   atenolol 25 MG tablet Commonly known as:  TENORMIN Take 25 mg by mouth daily.   cyclobenzaprine 10 MG tablet Commonly known as:  FLEXERIL Take 1 tablet (10 mg total) by mouth 3 (three) times daily as needed. Takes 10-20mg  What changed:  when to take this   diclofenac sodium 1 % Gel Commonly known as:  VOLTAREN Apply 1 application topically 3 (three) times daily as needed (pain).   docusate sodium 100 MG capsule Commonly known as:  COLACE Take 100 mg by mouth daily.   fexofenadine 60 MG  tablet Commonly known as:  ALLEGRA Take 120 mg by mouth daily. For allergies   fish oil-omega-3 fatty acids 1000 MG capsule Take 1 g by mouth every other day.   GLUCOSAMINE PO Take 1,500 mg by mouth daily. With MSM Hold while in hospital   HYDROmorphone 2 MG tablet Commonly known as:  DILAUDID Take 1 tablet (2 mg total) by mouth every 4 (four) hours as needed for severe pain.   hydroxypropyl methylcellulose / hypromellose 2.5 % ophthalmic solution Commonly known as:  ISOPTO TEARS / GONIOVISC Place 1 drop into both eyes daily as needed for dry eyes.   ketorolac 10 MG tablet Commonly known as:  TORADOL Take 1 tablet (10 mg total) by mouth every 8 (eight) hours.   Levothyroxine Sodium 125 MCG Caps Take 125 mcg by mouth daily. BRAND ONLY   lidocaine 5 % Commonly known as:  LIDODERM Place 1 patch onto the skin daily. Remove & Discard patch within 12 hours or as directed by MD   Lysine 1000 MG Tabs Take 1,000 mg by mouth daily.   mefloquine 250 MG tablet Commonly known as:  LARIAM Take 250 mg by mouth once a week.   ondansetron 4 MG tablet Commonly known as:  ZOFRAN Take 1 tablet (4 mg total) by mouth every 8 (eight) hours as needed for nausea or vomiting.   pantoprazole 40 MG tablet Commonly known as:  PROTONIX Take 40 mg by mouth daily.   PRESERVISION AREDS Caps Take 1 capsule by mouth 2 (two) times daily.   multivitamins ther. w/minerals Tabs tablet Take 1 tablet by mouth daily.   sennosides-docusate sodium 8.6-50 MG tablet Commonly known as:  SENOKOT-S Take 2 tablets by mouth daily.   VITAMIN D (CHOLECALCIFEROL) PO Take 400 Units by mouth daily.       Diagnostic Studies: Dg Ankle 2 Views Left  Result Date: 12/01/2015 CLINICAL DATA:  Acute left ankle pain. Symptoms for 1 year. No known injury. EXAM: LEFT ANKLE - 2 VIEW COMPARISON:  None. FINDINGS: Degenerative changes in the left ankle with spurring. Plantar calcaneal spur. No acute bony abnormality.  Specifically, no fracture, subluxation, or dislocation. Soft tissues are intact. IMPRESSION: No acute bony abnormality.  Osteoarthritis. Electronically Signed   By: Rolm Baptise M.D.   On: 12/01/2015 14:14   Ct Shoulder Left Wo Contrast  Result Date: 11/27/2015 CLINICAL DATA:  One year history of left shoulder pain. EXAM: CT OF THE LEFT SHOULDER WITHOUT CONTRAST TECHNIQUE: Multidetector CT imaging was performed according to the standard protocol. Multiplanar CT image reconstructions were also generated. COMPARISON:  None. FINDINGS: Advanced glenohumeral joint degenerative changes with joint space narrowing, osteophytic spurring and subchondral cystic  change. Slight widening and thinning of the glenoid. The humeral head is riding high in the glenoid fossa and is pseudo articulating with the acromion. The acromion appears fragmented. Findings consistent with a complete rotator cuff tear. The rotator cuff muscles demonstrate fatty atrophy. There is also marked narrowing of the coracohumeral space. Evidence of prior distal clavicle resection. Emphysematous changes are noted in the left lung. No worrisome lung lesions. The visualized ribs are intact. Aortic and coronary artery calcifications are noted. IMPRESSION: 1. Advanced glenohumeral joint degenerative changes. 2. Chronic rotator cuff tear with fatty atrophy of the muscles. 3. The humeral head is riding high in the glenoid fossa because of the rotator cuff tear in the acromion appears fragmented. 4. Evidence of prior shoulder surgery with distal clavicle resection. 5. Emphysematous changes noted in the left lung. Aortic and coronary artery calcifications are noted. Electronically Signed   By: Marijo Sanes M.D.   On: 11/27/2015 11:42   Dg Shoulder Left Port  Result Date: 12/09/2015 CLINICAL DATA:  Status post left shoulder replacement. EXAM: LEFT SHOULDER - 1 VIEW COMPARISON:  Left shoulder CT dated 11/27/2015. FINDINGS: Interval left shoulder prosthesis  with humeral head and glenoid components. The superior in satisfactory position and alignment on the frontal limits. There is a triangular shaped bone fragment and linear calcific density projected inferior to the acromion. IMPRESSION: 1. Satisfactory position and alignment of a left shoulder prosthesis in the frontal projection. 2. Bone fragment and linear calcific density projected inferior to the acromion. Electronically Signed   By: Claudie Revering M.D.   On: 12/09/2015 10:41   Dg Foot 2 Views Left  Result Date: 12/01/2015 CLINICAL DATA:  Medial left ankle pain. Soft tissue swelling. Symptoms for 1 year. No known injury. EXAM: LEFT FOOT - 2 VIEW COMPARISON:  Ankle series performed today. FINDINGS: Hallux valgus deformity at the first MTP joint. No acute bony abnormality. Specifically, no fracture, subluxation, or dislocation. Soft tissues are intact. Plantar calcaneal spur. IMPRESSION: Hallux valgus. No acute bony abnormality. Electronically Signed   By: Rolm Baptise M.D.   On: 12/01/2015 14:15    Disposition: 06-Home-Health Care Svc    Follow-up Information    Miyeko Mahlum P, MD. Schedule an appointment as soon as possible for a visit in 2 weeks.   Specialty:  Orthopedic Surgery Contact information: [place] [address] [phone]            Signed: Johnny [name] 12/10/2015, 8:41 AM

## 2015-12-10 NOTE — Discharge Planning (Signed)
Patient seen on rounds. Up to the bathroom and back to recliner. Doing well but worried about pain control at home. MD and patient discussing.  Patient will discharge to home with family when cleared by MD  No home health needs at this time.   Ladell Heads, [place]  [facility] [phone]

## 2015-12-14 ENCOUNTER — Emergency Department (HOSPITAL_COMMUNITY): Payer: Medicare Other

## 2015-12-14 ENCOUNTER — Emergency Department (HOSPITAL_COMMUNITY)
Admission: EM | Admit: 2015-12-14 | Discharge: 2015-12-14 | Disposition: A | Discharge status: Home / Self Care | Payer: Medicare Other | Attending: Emergency Medicine | Admitting: Emergency Medicine

## 2015-12-14 ENCOUNTER — Encounter (HOSPITAL_COMMUNITY): Payer: Self-pay | Admitting: Emergency Medicine

## 2015-12-14 DIAGNOSIS — Z7982 Long term (current) use of aspirin: Secondary | ICD-10-CM | POA: Diagnosis not present

## 2015-12-14 DIAGNOSIS — I10 Essential (primary) hypertension: Secondary | ICD-10-CM | POA: Diagnosis not present

## 2015-12-14 DIAGNOSIS — Z96612 Presence of left artificial shoulder joint: Secondary | ICD-10-CM | POA: Insufficient documentation

## 2015-12-14 DIAGNOSIS — R160 Hepatomegaly, not elsewhere classified: Secondary | ICD-10-CM | POA: Diagnosis not present

## 2015-12-14 DIAGNOSIS — D649 Anemia, unspecified: Secondary | ICD-10-CM | POA: Diagnosis not present

## 2015-12-14 DIAGNOSIS — Z96652 Presence of left artificial knee joint: Secondary | ICD-10-CM | POA: Diagnosis not present

## 2015-12-14 DIAGNOSIS — R079 Chest pain, unspecified: Secondary | ICD-10-CM | POA: Diagnosis not present

## 2015-12-14 DIAGNOSIS — R0789 Other chest pain: Secondary | ICD-10-CM | POA: Diagnosis not present

## 2015-12-14 DIAGNOSIS — Z96611 Presence of right artificial shoulder joint: Secondary | ICD-10-CM | POA: Diagnosis not present

## 2015-12-14 DIAGNOSIS — R531 Weakness: Secondary | ICD-10-CM | POA: Diagnosis not present

## 2015-12-14 LAB — CBC
HEMATOCRIT: 29.5 % — AB (ref 36.0–46.0)
Hemoglobin: 9.4 g/dL — ABNORMAL LOW (ref 12.0–15.0)
MCH: 29.4 pg (ref 26.0–34.0)
MCHC: 31.9 g/dL (ref 30.0–36.0)
MCV: 92.2 fL (ref 78.0–100.0)
PLATELETS: 352 10*3/uL (ref 150–400)
RBC: 3.2 MIL/uL — ABNORMAL LOW (ref 3.87–5.11)
RDW: 13.7 % (ref 11.5–15.5)
WBC: 7.5 10*3/uL (ref 4.0–10.5)

## 2015-12-14 LAB — I-STAT TROPONIN, ED: TROPONIN I, POC: 0.01 ng/mL (ref 0.00–0.08)

## 2015-12-14 LAB — BASIC METABOLIC PANEL
Anion gap: 9 (ref 5–15)
BUN: 15 mg/dL (ref 6–20)
CO2: 26 mmol/L (ref 22–32)
Calcium: 9.4 mg/dL (ref 8.9–10.3)
Chloride: 101 mmol/L (ref 101–111)
Creatinine, Ser: 0.9 mg/dL (ref 0.44–1.00)
GFR calc Af Amer: >60 mL/min (ref >60)
GFR, EST NON AFRICAN AMERICAN: 60 mL/min — AB (ref >60)
Glucose, Bld: 95 mg/dL (ref 65–99)
POTASSIUM: 4.4 mmol/L (ref 3.5–5.1)
SODIUM: 136 mmol/L (ref 135–145)

## 2015-12-14 MED ORDER — IOPAMIDOL (ISOVUE-370) INJECTION 76%
INTRAVENOUS | Status: AC
  Administered 2015-12-14: 100 mL
  Filled 2015-12-14: qty 100

## 2015-12-14 NOTE — ED Notes (Signed)
Minimal pain  Is now going to c-t

## 2015-12-14 NOTE — ED Triage Notes (Signed)
Pt states while walking into church this am she started feeling "not well" during church she started having pain in the center of her chest and in between her shoulder blades. Pt has left shoulder surgery on Tuesday for shoulder replacement. Pt is warm and dry.

## 2015-12-14 NOTE — Discharge Instructions (Signed)
Call Dr. Kenton Kingfisher tomorrow to schedule an office appointment for this week. Discussed with him possibly starting on iron again as you are anemic and feeling somewhat weak after surgery. The CT scan of your chest today shows a mass on your liver which was also present in 2013. Please mention this Dr. Kenton Kingfisher. May need to be followed for this with periodic repeat CAT scans or ultrasounds

## 2015-12-14 NOTE — ED Provider Notes (Addendum)
[facility] DEPT Provider Note   CSN: [id number] Arrival date & time: 12/14/15  1247     History   Chief Complaint Chief Complaint  Patient presents with  . Chest Pain    HPI Patricia [name] is a 81 y.o. female.  HPI [place] of left anterior chest pain lasting approximately a split second for the past several weeks. Pain is nonexertional, lasting several minutes at a time. Nothing makes symptoms better or worse. No associated shortness of breath nausea or sweatiness. Presently asymptomatic. No treatment prior to coming here. Past Medical History:  Diagnosis Date  . Arthritis   . Complication of anesthesia    nausea from fentayl; hypotension episode after previous shoulder surgery  . Degenerative arthritis of right shoulder region 03/06/2011  . GERD (gastroesophageal reflux disease)   . Glaucoma   . History of bronchitis   . History of kidney stones   . History of pneumonia   . Hypertension   . Iron deficiency anemia   . Left rotator cuff tear arthropathy 12/09/2015  . Macular degeneration disease   . Numbness    left foot and toes  . Rotator cuff tear, right 03/06/2011  . Spinal stenosis   . Spinal stenosis     Patient Active Problem List   Diagnosis Date Noted  . Left rotator cuff tear arthropathy 12/09/2015  . S/P shoulder replacement 12/09/2015  . Postoperative anemia due to acute blood loss 03/10/2011  . Rotator cuff tear, right 03/06/2011  . Degenerative arthritis of right shoulder region 03/06/2011    Past Surgical History:  Procedure Laterality Date  . ABDOMINAL HYSTERECTOMY    . CARPAL TUNNEL RELEASE    . DILATION AND CURETTAGE OF UTERUS    . EYE SURGERY Bilateral    cataract  . JOINT REPLACEMENT     lt knee-05  . KNEE SURGERY    . REVERSE SHOULDER ARTHROPLASTY  03/09/2011   Procedure: REVERSE SHOULDER ARTHROPLASTY;  Surgeon: Johnny Bridge;  Location: [facility];  Service: Orthopedics;  Laterality: Right;  . REVERSE SHOULDER  ARTHROPLASTY Left 12/09/2015   Procedure: REVERSE TOTAL SHOULDER ARTHROPLASTY;  Surgeon: Marchia Bond, MD;  Location: [facility];  Service: Orthopedics;  Laterality: Left;  . ROTATOR CUFF REPAIR, left      OB History    No data available       Home Medications    Prior to Admission medications   Medication Sig Start Date End Date Taking? Authorizing Provider  acetaminophen (TYLENOL) 650 MG CR tablet Take 650 mg by mouth 2 (two) times daily as needed for pain.    Historical Provider, MD  aspirin EC 81 MG tablet Take 81 mg by mouth daily.      Historical Provider, MD  atenolol (TENORMIN) 25 MG tablet Take 25 mg by mouth daily.      Historical Provider, MD  cyclobenzaprine (FLEXERIL) 10 MG tablet Take 1 tablet (10 mg total) by mouth 3 (three) times daily as needed. Takes 10-20mg  12/09/15   Marchia Bond, MD  diclofenac sodium (VOLTAREN) 1 % GEL Apply 1 application topically 3 (three) times daily as needed (pain).     Historical Provider, MD  docusate sodium (COLACE) 100 MG capsule Take 100 mg by mouth daily.    Historical Provider, MD  fexofenadine (ALLEGRA) 60 MG tablet Take 120 mg by mouth daily. For allergies    Historical Provider, MD  fish oil-omega-3 fatty acids 1000 MG capsule Take 1 g by mouth every other day.  Historical Provider, MD  GLUCOSAMINE PO Take 1,500 mg by mouth daily. With MSM Hold while in hospital    Historical Provider, MD  HYDROmorphone (DILAUDID) 2 MG tablet Take 1 tablet (2 mg total) by mouth every 4 (four) hours as needed for severe pain. 12/10/15   Marchia Bond, MD  hydroxypropyl methylcellulose (ISOPTO TEARS) 2.5 % ophthalmic solution Place 1 drop into both eyes daily as needed for dry eyes.     Historical Provider, MD  ketorolac (TORADOL) 10 MG tablet Take 1 tablet (10 mg total) by mouth every 8 (eight) hours. 12/10/15   Marchia Bond, MD  Levothyroxine Sodium 125 MCG CAPS Take 125 mcg by mouth daily. BRAND ONLY    Historical Provider, MD  lidocaine (LIDODERM) 5 %  Place 1 patch onto the skin daily. Remove & Discard patch within 12 hours or as directed by MD    Historical Provider, MD  Lysine 1000 MG TABS Take 1,000 mg by mouth daily.      Historical Provider, MD  mefloquine (LARIAM) 250 MG tablet Take 250 mg by mouth once a week.     Historical Provider, MD  Multiple Vitamins-Minerals (MULTIVITAMINS THER. W/MINERALS) TABS Take 1 tablet by mouth daily.      Historical Provider, MD  Multiple Vitamins-Minerals (PRESERVISION AREDS) CAPS Take 1 capsule by mouth 2 (two) times daily.      Historical Provider, MD  ondansetron (ZOFRAN) 4 MG tablet Take 1 tablet (4 mg total) by mouth every 8 (eight) hours as needed for nausea or vomiting. 12/09/15   Marchia Bond, MD  pantoprazole (PROTONIX) 40 MG tablet Take 40 mg by mouth daily.      Historical Provider, MD  sennosides-docusate sodium (SENOKOT-S) 8.6-50 MG tablet Take 2 tablets by mouth daily. 12/09/15   Marchia Bond, MD  VITAMIN D, CHOLECALCIFEROL, PO Take 400 Units by mouth daily.      Historical Provider, MD  zolpidem (AMBIEN) 5 MG tablet Take 5 mg by mouth at bedtime as needed for sleep.     Historical Provider, MD    Family History No family history on file.  Social History Social History  Substance Use Topics  . Smoking status: Never Smoker  . Smokeless tobacco: Never Used  . Alcohol use No     Allergies   Codeine; Fentanyl and related; Meclizine; Tramadol; and Sulfa antibiotics   Review of Systems Review of Systems  HENT: Negative.   Respiratory: Negative.   Cardiovascular: Positive for chest pain.  Gastrointestinal: Negative.   Musculoskeletal: Positive for arthralgias.       Left upper extremity in sling  Skin: Negative.   Neurological: Positive for weakness.       Generalized weakness  Psychiatric/Behavioral: Negative.   All other systems reviewed and are negative.    Physical Exam Updated Vital Signs BP 170/88 (BP Location: Right Arm)   Pulse 75   Temp 97.9 F (36.6 C) (Oral)    Resp 17   Ht 5' (1.524 m)   Wt 144 lb (65.3 kg)   SpO2 100%   BMI 28.12 kg/m   Physical Exam  Constitutional: She appears well-developed and well-nourished.  HENT:  Head: Normocephalic and atraumatic.  Eyes: Conjunctivae are normal. Pupils are equal, round, and reactive to light.  Neck: Neck supple. No tracheal deviation present. No thyromegaly present.  Cardiovascular: Normal rate and regular rhythm.   No murmur heard. Pulmonary/Chest: Effort normal and breath sounds normal.  Abdominal: Soft. Bowel sounds are normal. She exhibits no distension.  There is no tenderness.  Musculoskeletal: Normal range of motion. She exhibits no edema or tenderness.  Left upper extremity and sling. No point no swelling. Radial pulse 2+ extremities otherwise normal and neurovascularly intact  Neurological: She is alert. Coordination normal.  Skin: Skin is warm and dry. No rash noted.  Psychiatric: She has a normal mood and affect.  Nursing note and vitals reviewed.    ED Treatments / Results  Labs (all labs ordered are listed, but only abnormal results are displayed) Labs Reviewed  BASIC METABOLIC PANEL - Abnormal; Notable for the following:       Result Value   GFR calc non Af Amer 60 (*)    All other components within normal limits  CBC - Abnormal; Notable for the following:    RBC 3.20 (*)    Hemoglobin 9.4 (*)    HCT 29.5 (*)    All other components within normal limits  I-STAT TROPOININ, ED    EKG  EKG Interpretation  Date/Time:  Sunday December 14 2015 12:52:17 EDT Ventricular Rate:  70 PR Interval:  166 QRS Duration: 76 QT Interval:  398 QTC Calculation: 429 R Axis:   65 Text Interpretation:  Normal sinus rhythm Normal ECG No significant change since last tracing Confirmed by Winfred Leeds  MD, Rakim Moone [phone]) on 12/14/2015 2:08:34 PM       Radiology Dg Chest 2 View  Result Date: 12/14/2015 CLINICAL DATA:  Chest pain EXAM: CHEST  2 VIEW COMPARISON:  03/07/2011 FINDINGS: The  patient has undergone interval left total shoulder arthroplasty. The heart size is normal. No pleural effusion or edema. There is atelectasis identified within the left lung base. IMPRESSION: 1. Left base atelectasis. 2. Status post left total shoulder arthroplasty. Electronically Signed   By: Kerby Moors M.D.   On: 12/14/2015 13:47    Procedures Procedures (including critical care time)  Medications Ordered in ED Medications - No data to display  Chest x-ray viewed by me. Results for orders placed or performed during the hospital encounter of [id number]  Basic metabolic panel  Result Value Ref Range   Sodium 136 135 - 145 mmol/L   Potassium 4.4 3.5 - 5.1 mmol/L   Chloride 101 101 - 111 mmol/L   CO2 26 22 - 32 mmol/L   Glucose, Bld 95 65 - 99 mg/dL   BUN 15 6 - 20 mg/dL   Creatinine, Ser 0.90 0.44 - 1.00 mg/dL   Calcium 9.4 8.9 - 10.3 mg/dL   GFR calc non Af Amer 60 (L) >60 mL/min   GFR calc Af Amer >60 >60 mL/min   Anion gap 9 5 - 15  CBC  Result Value Ref Range   WBC 7.5 4.0 - 10.5 K/uL   RBC 3.20 (L) 3.87 - 5.11 MIL/uL   Hemoglobin 9.4 (L) 12.0 - 15.0 g/dL   HCT 29.5 (L) 36.0 - 46.0 %   MCV 92.2 78.0 - 100.0 fL   MCH 29.4 26.0 - 34.0 pg   MCHC 31.9 30.0 - 36.0 g/dL   RDW 13.7 11.5 - 15.5 %   Platelets 352 150 - 400 K/uL  I-stat troponin, ED  Result Value Ref Range   Troponin i, poc 0.01 0.00 - 0.08 ng/mL   Comment 3           Dg Chest 2 View  Result Date: 12/14/2015 CLINICAL DATA:  Chest pain EXAM: CHEST  2 VIEW COMPARISON:  03/07/2011 FINDINGS: The patient has undergone interval left total shoulder arthroplasty. The  heart size is normal. No pleural effusion or edema. There is atelectasis identified within the left lung base. IMPRESSION: 1. Left base atelectasis. 2. Status post left total shoulder arthroplasty. Electronically Signed   By: Kerby Moors M.D.   On: 12/14/2015 13:47   Dg Ankle 2 Views Left  Result Date: 12/01/2015 CLINICAL DATA:  Acute left ankle pain.  Symptoms for 1 year. No known injury. EXAM: LEFT ANKLE - 2 VIEW COMPARISON:  None. FINDINGS: Degenerative changes in the left ankle with spurring. Plantar calcaneal spur. No acute bony abnormality. Specifically, no fracture, subluxation, or dislocation. Soft tissues are intact. IMPRESSION: No acute bony abnormality.  Osteoarthritis. Electronically Signed   By: Rolm Baptise M.D.   On: 12/01/2015 14:14   Ct Angio Chest Pe W Or Wo Contrast  Result Date: 12/14/2015 CLINICAL DATA:  Chest pain and fatigue.  Prior shoulder surgery. EXAM: CT ANGIOGRAPHY CHEST WITH CONTRAST TECHNIQUE: Multidetector CT imaging of the chest was performed using the standard protocol during bolus administration of intravenous contrast. Multiplanar CT image reconstructions and MIPs were obtained to evaluate the vascular anatomy. CONTRAST:  100 mL Isovue 370 COMPARISON:  None. FINDINGS: Mediastinum/Lymph Nodes: No pulmonary emboli identified. No masses or pathologically enlarged lymph nodes identified. Normal heart size. Aortic atherosclerosis. Lungs/Pleura: Left lower lobe fibrosis. No focal consolidation, pleural effusion or pneumothorax. Upper abdomen: 3.7 x 2.8 cm hypodense right hepatic mass with a small area of nodular enhancement peripherally smaller in size compared to the prior examination of 06/23/2011. Musculoskeletal: No chest wall mass or suspicious bone lesions identified. Bilateral total reverse shoulder arthroplasties. Review of the MIP images confirms the above findings. IMPRESSION: 1. No evidence pulmonary embolus. 2.  Aortic Atherosclerosis (ICD10-170.0) Electronically Signed   By: Kathreen Devoid   On: 12/14/2015 16:25   Ct Shoulder Left Wo Contrast  Result Date: 11/27/2015 CLINICAL DATA:  One year history of left shoulder pain. EXAM: CT OF THE LEFT SHOULDER WITHOUT CONTRAST TECHNIQUE: Multidetector CT imaging was performed according to the standard protocol. Multiplanar CT image reconstructions were also generated.  COMPARISON:  None. FINDINGS: Advanced glenohumeral joint degenerative changes with joint space narrowing, osteophytic spurring and subchondral cystic change. Slight widening and thinning of the glenoid. The humeral head is riding high in the glenoid fossa and is pseudo articulating with the acromion. The acromion appears fragmented. Findings consistent with a complete rotator cuff tear. The rotator cuff muscles demonstrate fatty atrophy. There is also marked narrowing of the coracohumeral space. Evidence of prior distal clavicle resection. Emphysematous changes are noted in the left lung. No worrisome lung lesions. The visualized ribs are intact. Aortic and coronary artery calcifications are noted. IMPRESSION: 1. Advanced glenohumeral joint degenerative changes. 2. Chronic rotator cuff tear with fatty atrophy of the muscles. 3. The humeral head is riding high in the glenoid fossa because of the rotator cuff tear in the acromion appears fragmented. 4. Evidence of prior shoulder surgery with distal clavicle resection. 5. Emphysematous changes noted in the left lung. Aortic and coronary artery calcifications are noted. Electronically Signed   By: Marijo Sanes M.D.   On: 11/27/2015 11:42   Dg Shoulder Left Port  Result Date: 12/09/2015 CLINICAL DATA:  Status post left shoulder replacement. EXAM: LEFT SHOULDER - 1 VIEW COMPARISON:  Left shoulder CT dated 11/27/2015. FINDINGS: Interval left shoulder prosthesis with humeral head and glenoid components. The superior in satisfactory position and alignment on the frontal limits. There is a triangular shaped bone fragment and linear calcific density projected inferior  to the acromion. IMPRESSION: 1. Satisfactory position and alignment of a left shoulder prosthesis in the frontal projection. 2. Bone fragment and linear calcific density projected inferior to the acromion. Electronically Signed   By: Claudie Revering M.D.   On: 12/09/2015 10:41   Dg Foot 2 Views Left  Result  Date: 12/01/2015 CLINICAL DATA:  Medial left ankle pain. Soft tissue swelling. Symptoms for 1 year. No known injury. EXAM: LEFT FOOT - 2 VIEW COMPARISON:  Ankle series performed today. FINDINGS: Hallux valgus deformity at the first MTP joint. No acute bony abnormality. Specifically, no fracture, subluxation, or dislocation. Soft tissues are intact. Plantar calcaneal spur. IMPRESSION: Hallux valgus. No acute bony abnormality. Electronically Signed   By: Rolm Baptise M.D.   On: 12/01/2015 14:15   Initial Impression / Assessment and Plan / ED Course  I have reviewed the triage vital signs and the nursing notes.  Pertinent labs & imaging results that were available during my care of the patient were reviewed by me and considered in my medical decision making (see chart for details).  Clinical Course    4:35 PM patient patient asymptomatic except feels generally weak. Doubt acute coronary syndrome. Atypical symptoms. Normal EKG. Heart score 3. No evidence of pulmonary embolism on CT angiogram Patient is likely anemic secondary to blood loss after surgery. Hemoglobin was 9.0 four days ago I've discussed placing her on iron which she declines. I advised her to call Dr. Kenton Kingfisher, her PCP tomorrow for follow-up this week. I've also told patient about liver mass from 2013. Dr. Kenton Kingfisher will be emailed regarding need for follow-up and liver mass. Final Clinical Impressions(s) / ED Diagnoses   Final diagnoses:  None  Diagnosis #1 atypical chest pain #2 liver mass #3 anemia #4 weakness  New Prescriptions New Prescriptions   No medications on file     Orlie Dakin, MD 12/14/15 [place], MD 02/05/16 1708

## 2015-12-24 DIAGNOSIS — M19012 Primary osteoarthritis, left shoulder: Secondary | ICD-10-CM | POA: Diagnosis not present

## 2016-01-21 DIAGNOSIS — M19012 Primary osteoarthritis, left shoulder: Secondary | ICD-10-CM | POA: Diagnosis not present

## 2016-01-26 DIAGNOSIS — Z96612 Presence of left artificial shoulder joint: Secondary | ICD-10-CM | POA: Diagnosis not present

## 2016-01-26 DIAGNOSIS — M25512 Pain in left shoulder: Secondary | ICD-10-CM | POA: Diagnosis not present

## 2016-01-26 DIAGNOSIS — R29898 Other symptoms and signs involving the musculoskeletal system: Secondary | ICD-10-CM | POA: Diagnosis not present

## 2016-01-26 DIAGNOSIS — M25612 Stiffness of left shoulder, not elsewhere classified: Secondary | ICD-10-CM | POA: Diagnosis not present

## 2016-01-26 DIAGNOSIS — R293 Abnormal posture: Secondary | ICD-10-CM | POA: Diagnosis not present

## 2016-01-28 DIAGNOSIS — M25612 Stiffness of left shoulder, not elsewhere classified: Secondary | ICD-10-CM | POA: Diagnosis not present

## 2016-01-28 DIAGNOSIS — M25512 Pain in left shoulder: Secondary | ICD-10-CM | POA: Diagnosis not present

## 2016-01-28 DIAGNOSIS — Z96612 Presence of left artificial shoulder joint: Secondary | ICD-10-CM | POA: Diagnosis not present

## 2016-01-28 DIAGNOSIS — R293 Abnormal posture: Secondary | ICD-10-CM | POA: Diagnosis not present

## 2016-01-28 DIAGNOSIS — R29898 Other symptoms and signs involving the musculoskeletal system: Secondary | ICD-10-CM | POA: Diagnosis not present

## 2016-01-30 DIAGNOSIS — R293 Abnormal posture: Secondary | ICD-10-CM | POA: Diagnosis not present

## 2016-01-30 DIAGNOSIS — M25612 Stiffness of left shoulder, not elsewhere classified: Secondary | ICD-10-CM | POA: Diagnosis not present

## 2016-01-30 DIAGNOSIS — R29898 Other symptoms and signs involving the musculoskeletal system: Secondary | ICD-10-CM | POA: Diagnosis not present

## 2016-01-30 DIAGNOSIS — Z96612 Presence of left artificial shoulder joint: Secondary | ICD-10-CM | POA: Diagnosis not present

## 2016-01-30 DIAGNOSIS — M25512 Pain in left shoulder: Secondary | ICD-10-CM | POA: Diagnosis not present

## 2016-02-02 ENCOUNTER — Ambulatory Visit (INDEPENDENT_AMBULATORY_CARE_PROVIDER_SITE_OTHER): Payer: Medicare Other | Admitting: Orthopedic Surgery

## 2016-02-02 ENCOUNTER — Encounter (INDEPENDENT_AMBULATORY_CARE_PROVIDER_SITE_OTHER): Payer: Self-pay

## 2016-02-02 DIAGNOSIS — M25512 Pain in left shoulder: Secondary | ICD-10-CM | POA: Diagnosis not present

## 2016-02-02 DIAGNOSIS — Z96612 Presence of left artificial shoulder joint: Secondary | ICD-10-CM | POA: Diagnosis not present

## 2016-02-02 DIAGNOSIS — R29898 Other symptoms and signs involving the musculoskeletal system: Secondary | ICD-10-CM | POA: Diagnosis not present

## 2016-02-02 DIAGNOSIS — R293 Abnormal posture: Secondary | ICD-10-CM | POA: Diagnosis not present

## 2016-02-02 DIAGNOSIS — M25612 Stiffness of left shoulder, not elsewhere classified: Secondary | ICD-10-CM | POA: Diagnosis not present

## 2016-02-02 DIAGNOSIS — M76822 Posterior tibial tendinitis, left leg: Secondary | ICD-10-CM | POA: Diagnosis not present

## 2016-02-04 DIAGNOSIS — M25512 Pain in left shoulder: Secondary | ICD-10-CM | POA: Diagnosis not present

## 2016-02-04 DIAGNOSIS — Z96612 Presence of left artificial shoulder joint: Secondary | ICD-10-CM | POA: Diagnosis not present

## 2016-02-04 DIAGNOSIS — R293 Abnormal posture: Secondary | ICD-10-CM | POA: Diagnosis not present

## 2016-02-04 DIAGNOSIS — M25612 Stiffness of left shoulder, not elsewhere classified: Secondary | ICD-10-CM | POA: Diagnosis not present

## 2016-02-04 DIAGNOSIS — R29898 Other symptoms and signs involving the musculoskeletal system: Secondary | ICD-10-CM | POA: Diagnosis not present

## 2016-02-06 DIAGNOSIS — R29898 Other symptoms and signs involving the musculoskeletal system: Secondary | ICD-10-CM | POA: Diagnosis not present

## 2016-02-06 DIAGNOSIS — M25512 Pain in left shoulder: Secondary | ICD-10-CM | POA: Diagnosis not present

## 2016-02-06 DIAGNOSIS — R293 Abnormal posture: Secondary | ICD-10-CM | POA: Diagnosis not present

## 2016-02-06 DIAGNOSIS — M25612 Stiffness of left shoulder, not elsewhere classified: Secondary | ICD-10-CM | POA: Diagnosis not present

## 2016-02-06 DIAGNOSIS — Z96612 Presence of left artificial shoulder joint: Secondary | ICD-10-CM | POA: Diagnosis not present

## 2016-02-09 DIAGNOSIS — M25512 Pain in left shoulder: Secondary | ICD-10-CM | POA: Diagnosis not present

## 2016-02-09 DIAGNOSIS — R293 Abnormal posture: Secondary | ICD-10-CM | POA: Diagnosis not present

## 2016-02-09 DIAGNOSIS — M25612 Stiffness of left shoulder, not elsewhere classified: Secondary | ICD-10-CM | POA: Diagnosis not present

## 2016-02-09 DIAGNOSIS — R29898 Other symptoms and signs involving the musculoskeletal system: Secondary | ICD-10-CM | POA: Diagnosis not present

## 2016-02-09 DIAGNOSIS — Z96612 Presence of left artificial shoulder joint: Secondary | ICD-10-CM | POA: Diagnosis not present

## 2016-02-11 ENCOUNTER — Telehealth (INDEPENDENT_AMBULATORY_CARE_PROVIDER_SITE_OTHER): Payer: Self-pay | Admitting: *Deleted

## 2016-02-11 DIAGNOSIS — Z96612 Presence of left artificial shoulder joint: Secondary | ICD-10-CM | POA: Diagnosis not present

## 2016-02-11 DIAGNOSIS — M25612 Stiffness of left shoulder, not elsewhere classified: Secondary | ICD-10-CM | POA: Diagnosis not present

## 2016-02-11 DIAGNOSIS — M25512 Pain in left shoulder: Secondary | ICD-10-CM | POA: Diagnosis not present

## 2016-02-11 DIAGNOSIS — R29898 Other symptoms and signs involving the musculoskeletal system: Secondary | ICD-10-CM | POA: Diagnosis not present

## 2016-02-11 DIAGNOSIS — R293 Abnormal posture: Secondary | ICD-10-CM | POA: Diagnosis not present

## 2016-02-11 NOTE — Telephone Encounter (Signed)
Pt. Called stating she would like a different boot for foot. She mentioned an "[place] brace" would work better if she could get a prescription for that instead. She is returning to [place] in December and the current one is causing pain. Call back number is [phone]

## 2016-02-13 DIAGNOSIS — M25612 Stiffness of left shoulder, not elsewhere classified: Secondary | ICD-10-CM | POA: Diagnosis not present

## 2016-02-13 DIAGNOSIS — Z96612 Presence of left artificial shoulder joint: Secondary | ICD-10-CM | POA: Diagnosis not present

## 2016-02-13 DIAGNOSIS — R29898 Other symptoms and signs involving the musculoskeletal system: Secondary | ICD-10-CM | POA: Diagnosis not present

## 2016-02-13 DIAGNOSIS — M25512 Pain in left shoulder: Secondary | ICD-10-CM | POA: Diagnosis not present

## 2016-02-13 DIAGNOSIS — R293 Abnormal posture: Secondary | ICD-10-CM | POA: Diagnosis not present

## 2016-02-13 NOTE — Telephone Encounter (Signed)
sure

## 2016-02-16 DIAGNOSIS — R293 Abnormal posture: Secondary | ICD-10-CM | POA: Diagnosis not present

## 2016-02-16 DIAGNOSIS — M25612 Stiffness of left shoulder, not elsewhere classified: Secondary | ICD-10-CM | POA: Diagnosis not present

## 2016-02-16 DIAGNOSIS — Z96612 Presence of left artificial shoulder joint: Secondary | ICD-10-CM | POA: Diagnosis not present

## 2016-02-16 DIAGNOSIS — M25512 Pain in left shoulder: Secondary | ICD-10-CM | POA: Diagnosis not present

## 2016-02-16 DIAGNOSIS — E039 Hypothyroidism, unspecified: Secondary | ICD-10-CM | POA: Diagnosis not present

## 2016-02-16 DIAGNOSIS — R29898 Other symptoms and signs involving the musculoskeletal system: Secondary | ICD-10-CM | POA: Diagnosis not present

## 2016-02-16 NOTE — Telephone Encounter (Signed)
I called spoke with patient to advise [place] brace rx at front desk for pick up.

## 2016-02-17 DIAGNOSIS — I10 Essential (primary) hypertension: Secondary | ICD-10-CM | POA: Diagnosis not present

## 2016-02-17 DIAGNOSIS — G8929 Other chronic pain: Secondary | ICD-10-CM | POA: Diagnosis not present

## 2016-02-17 DIAGNOSIS — E039 Hypothyroidism, unspecified: Secondary | ICD-10-CM | POA: Diagnosis not present

## 2016-02-17 DIAGNOSIS — F5101 Primary insomnia: Secondary | ICD-10-CM | POA: Diagnosis not present

## 2016-02-17 DIAGNOSIS — M545 Low back pain: Secondary | ICD-10-CM | POA: Diagnosis not present

## 2016-02-17 DIAGNOSIS — Z23 Encounter for immunization: Secondary | ICD-10-CM | POA: Diagnosis not present

## 2016-02-17 DIAGNOSIS — K7689 Other specified diseases of liver: Secondary | ICD-10-CM | POA: Diagnosis not present

## 2016-02-18 DIAGNOSIS — Z803 Family history of malignant neoplasm of breast: Secondary | ICD-10-CM | POA: Diagnosis not present

## 2016-02-18 DIAGNOSIS — M25612 Stiffness of left shoulder, not elsewhere classified: Secondary | ICD-10-CM | POA: Diagnosis not present

## 2016-02-18 DIAGNOSIS — R29898 Other symptoms and signs involving the musculoskeletal system: Secondary | ICD-10-CM | POA: Diagnosis not present

## 2016-02-18 DIAGNOSIS — M19012 Primary osteoarthritis, left shoulder: Secondary | ICD-10-CM | POA: Diagnosis not present

## 2016-02-18 DIAGNOSIS — R293 Abnormal posture: Secondary | ICD-10-CM | POA: Diagnosis not present

## 2016-02-18 DIAGNOSIS — Z1231 Encounter for screening mammogram for malignant neoplasm of breast: Secondary | ICD-10-CM | POA: Diagnosis not present

## 2016-02-18 DIAGNOSIS — Z96612 Presence of left artificial shoulder joint: Secondary | ICD-10-CM | POA: Diagnosis not present

## 2016-02-18 DIAGNOSIS — M25512 Pain in left shoulder: Secondary | ICD-10-CM | POA: Diagnosis not present

## 2016-02-25 DIAGNOSIS — R29898 Other symptoms and signs involving the musculoskeletal system: Secondary | ICD-10-CM | POA: Diagnosis not present

## 2016-02-25 DIAGNOSIS — Z96612 Presence of left artificial shoulder joint: Secondary | ICD-10-CM | POA: Diagnosis not present

## 2016-02-25 DIAGNOSIS — M25512 Pain in left shoulder: Secondary | ICD-10-CM | POA: Diagnosis not present

## 2016-02-25 DIAGNOSIS — M25612 Stiffness of left shoulder, not elsewhere classified: Secondary | ICD-10-CM | POA: Diagnosis not present

## 2016-02-25 DIAGNOSIS — R293 Abnormal posture: Secondary | ICD-10-CM | POA: Diagnosis not present

## 2016-02-26 DIAGNOSIS — R293 Abnormal posture: Secondary | ICD-10-CM | POA: Diagnosis not present

## 2016-02-26 DIAGNOSIS — M25512 Pain in left shoulder: Secondary | ICD-10-CM | POA: Diagnosis not present

## 2016-02-26 DIAGNOSIS — R29898 Other symptoms and signs involving the musculoskeletal system: Secondary | ICD-10-CM | POA: Diagnosis not present

## 2016-02-26 DIAGNOSIS — Z96612 Presence of left artificial shoulder joint: Secondary | ICD-10-CM | POA: Diagnosis not present

## 2016-02-26 DIAGNOSIS — M25612 Stiffness of left shoulder, not elsewhere classified: Secondary | ICD-10-CM | POA: Diagnosis not present

## 2016-03-05 DIAGNOSIS — M25512 Pain in left shoulder: Secondary | ICD-10-CM | POA: Diagnosis not present

## 2016-03-05 DIAGNOSIS — Z96612 Presence of left artificial shoulder joint: Secondary | ICD-10-CM | POA: Diagnosis not present

## 2016-03-05 DIAGNOSIS — M25612 Stiffness of left shoulder, not elsewhere classified: Secondary | ICD-10-CM | POA: Diagnosis not present

## 2016-03-05 DIAGNOSIS — R293 Abnormal posture: Secondary | ICD-10-CM | POA: Diagnosis not present

## 2016-03-05 DIAGNOSIS — R29898 Other symptoms and signs involving the musculoskeletal system: Secondary | ICD-10-CM | POA: Diagnosis not present

## 2016-03-08 DIAGNOSIS — H353130 Nonexudative age-related macular degeneration, bilateral, stage unspecified: Secondary | ICD-10-CM | POA: Insufficient documentation

## 2016-03-08 DIAGNOSIS — Z96612 Presence of left artificial shoulder joint: Secondary | ICD-10-CM | POA: Diagnosis not present

## 2016-03-08 DIAGNOSIS — H401231 Low-tension glaucoma, bilateral, mild stage: Secondary | ICD-10-CM | POA: Diagnosis not present

## 2016-03-08 DIAGNOSIS — R293 Abnormal posture: Secondary | ICD-10-CM | POA: Diagnosis not present

## 2016-03-08 DIAGNOSIS — M25612 Stiffness of left shoulder, not elsewhere classified: Secondary | ICD-10-CM | POA: Diagnosis not present

## 2016-03-08 DIAGNOSIS — H524 Presbyopia: Secondary | ICD-10-CM | POA: Diagnosis not present

## 2016-03-08 DIAGNOSIS — M25512 Pain in left shoulder: Secondary | ICD-10-CM | POA: Diagnosis not present

## 2016-03-08 DIAGNOSIS — Z961 Presence of intraocular lens: Secondary | ICD-10-CM | POA: Diagnosis not present

## 2016-03-08 DIAGNOSIS — R29898 Other symptoms and signs involving the musculoskeletal system: Secondary | ICD-10-CM | POA: Diagnosis not present

## 2016-03-08 HISTORY — DX: Nonexudative age-related macular degeneration, bilateral, stage unspecified: H35.3130

## 2016-03-09 DIAGNOSIS — R29898 Other symptoms and signs involving the musculoskeletal system: Secondary | ICD-10-CM | POA: Diagnosis not present

## 2016-03-09 DIAGNOSIS — M25512 Pain in left shoulder: Secondary | ICD-10-CM | POA: Diagnosis not present

## 2016-03-09 DIAGNOSIS — R293 Abnormal posture: Secondary | ICD-10-CM | POA: Diagnosis not present

## 2016-03-09 DIAGNOSIS — M25612 Stiffness of left shoulder, not elsewhere classified: Secondary | ICD-10-CM | POA: Diagnosis not present

## 2016-03-09 DIAGNOSIS — Z96612 Presence of left artificial shoulder joint: Secondary | ICD-10-CM | POA: Diagnosis not present

## 2016-03-10 DIAGNOSIS — R293 Abnormal posture: Secondary | ICD-10-CM | POA: Diagnosis not present

## 2016-03-10 DIAGNOSIS — M25512 Pain in left shoulder: Secondary | ICD-10-CM | POA: Diagnosis not present

## 2016-03-10 DIAGNOSIS — R29898 Other symptoms and signs involving the musculoskeletal system: Secondary | ICD-10-CM | POA: Diagnosis not present

## 2016-03-10 DIAGNOSIS — D1801 Hemangioma of skin and subcutaneous tissue: Secondary | ICD-10-CM | POA: Diagnosis not present

## 2016-03-10 DIAGNOSIS — L570 Actinic keratosis: Secondary | ICD-10-CM | POA: Diagnosis not present

## 2016-03-10 DIAGNOSIS — M25612 Stiffness of left shoulder, not elsewhere classified: Secondary | ICD-10-CM | POA: Diagnosis not present

## 2016-03-10 DIAGNOSIS — L821 Other seborrheic keratosis: Secondary | ICD-10-CM | POA: Diagnosis not present

## 2016-03-10 DIAGNOSIS — L905 Scar conditions and fibrosis of skin: Secondary | ICD-10-CM | POA: Diagnosis not present

## 2016-03-10 DIAGNOSIS — Z96612 Presence of left artificial shoulder joint: Secondary | ICD-10-CM | POA: Diagnosis not present

## 2016-03-10 DIAGNOSIS — X32XXXA Exposure to sunlight, initial encounter: Secondary | ICD-10-CM | POA: Diagnosis not present

## 2016-03-15 DIAGNOSIS — M25612 Stiffness of left shoulder, not elsewhere classified: Secondary | ICD-10-CM | POA: Diagnosis not present

## 2016-03-15 DIAGNOSIS — E039 Hypothyroidism, unspecified: Secondary | ICD-10-CM | POA: Diagnosis not present

## 2016-03-15 DIAGNOSIS — R29898 Other symptoms and signs involving the musculoskeletal system: Secondary | ICD-10-CM | POA: Diagnosis not present

## 2016-03-15 DIAGNOSIS — Z96612 Presence of left artificial shoulder joint: Secondary | ICD-10-CM | POA: Diagnosis not present

## 2016-03-15 DIAGNOSIS — R293 Abnormal posture: Secondary | ICD-10-CM | POA: Diagnosis not present

## 2016-03-15 DIAGNOSIS — M25512 Pain in left shoulder: Secondary | ICD-10-CM | POA: Diagnosis not present

## 2016-03-16 DIAGNOSIS — R29898 Other symptoms and signs involving the musculoskeletal system: Secondary | ICD-10-CM | POA: Diagnosis not present

## 2016-03-16 DIAGNOSIS — M25612 Stiffness of left shoulder, not elsewhere classified: Secondary | ICD-10-CM | POA: Diagnosis not present

## 2016-03-16 DIAGNOSIS — R293 Abnormal posture: Secondary | ICD-10-CM | POA: Diagnosis not present

## 2016-03-16 DIAGNOSIS — M25512 Pain in left shoulder: Secondary | ICD-10-CM | POA: Diagnosis not present

## 2016-03-16 DIAGNOSIS — Z96612 Presence of left artificial shoulder joint: Secondary | ICD-10-CM | POA: Diagnosis not present

## 2016-03-17 DIAGNOSIS — M25512 Pain in left shoulder: Secondary | ICD-10-CM | POA: Diagnosis not present

## 2016-03-17 DIAGNOSIS — M25612 Stiffness of left shoulder, not elsewhere classified: Secondary | ICD-10-CM | POA: Diagnosis not present

## 2016-03-17 DIAGNOSIS — R29898 Other symptoms and signs involving the musculoskeletal system: Secondary | ICD-10-CM | POA: Diagnosis not present

## 2016-03-17 DIAGNOSIS — Z96612 Presence of left artificial shoulder joint: Secondary | ICD-10-CM | POA: Diagnosis not present

## 2016-03-17 DIAGNOSIS — R293 Abnormal posture: Secondary | ICD-10-CM | POA: Diagnosis not present

## 2016-11-08 DIAGNOSIS — M353 Polymyalgia rheumatica: Secondary | ICD-10-CM | POA: Diagnosis not present

## 2016-11-12 DIAGNOSIS — E039 Hypothyroidism, unspecified: Secondary | ICD-10-CM | POA: Diagnosis not present

## 2016-11-12 DIAGNOSIS — M859 Disorder of bone density and structure, unspecified: Secondary | ICD-10-CM | POA: Diagnosis not present

## 2016-11-12 DIAGNOSIS — M1990 Unspecified osteoarthritis, unspecified site: Secondary | ICD-10-CM | POA: Diagnosis not present

## 2016-11-12 DIAGNOSIS — M150 Primary generalized (osteo)arthritis: Secondary | ICD-10-CM | POA: Diagnosis not present

## 2016-11-12 DIAGNOSIS — M2550 Pain in unspecified joint: Secondary | ICD-10-CM | POA: Diagnosis not present

## 2016-11-12 DIAGNOSIS — Z1389 Encounter for screening for other disorder: Secondary | ICD-10-CM | POA: Diagnosis not present

## 2016-11-12 DIAGNOSIS — I10 Essential (primary) hypertension: Secondary | ICD-10-CM | POA: Diagnosis not present

## 2016-11-12 DIAGNOSIS — F5101 Primary insomnia: Secondary | ICD-10-CM | POA: Diagnosis not present

## 2016-11-12 DIAGNOSIS — E7800 Pure hypercholesterolemia, unspecified: Secondary | ICD-10-CM | POA: Diagnosis not present

## 2016-11-12 DIAGNOSIS — M8580 Other specified disorders of bone density and structure, unspecified site: Secondary | ICD-10-CM | POA: Diagnosis not present

## 2016-11-12 DIAGNOSIS — M791 Myalgia: Secondary | ICD-10-CM | POA: Diagnosis not present

## 2017-04-02 IMAGING — CT CT SHOULDER*L* W/O CM
2 series · 14 of 20 positions shown, 17 images · non-contrast
Comparison: None.

CLINICAL DATA: One year history of left shoulder pain.

EXAM:
CT OF THE LEFT SHOULDER WITHOUT CONTRAST
TECHNIQUE: Multidetector CT imaging was performed according to the standard
protocol. Multiplanar CT image reconstructions were also generated.

[Series 2: soft tissue · axial · 0.47mm/px · z∈[+835,+973]mm · 11 of 83 slices shown, 14 images]
[im 7/83  soft-tissue]
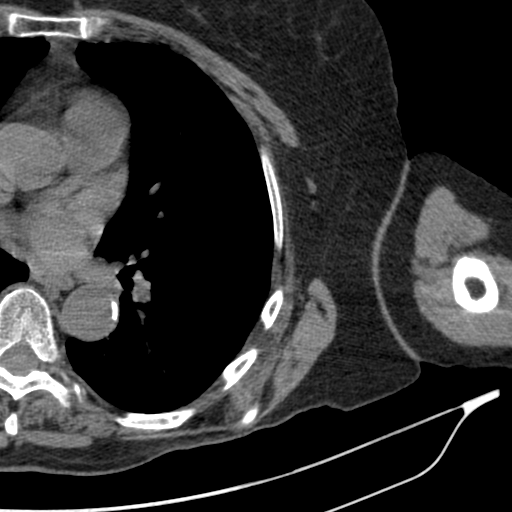
[im 7/83  bone]
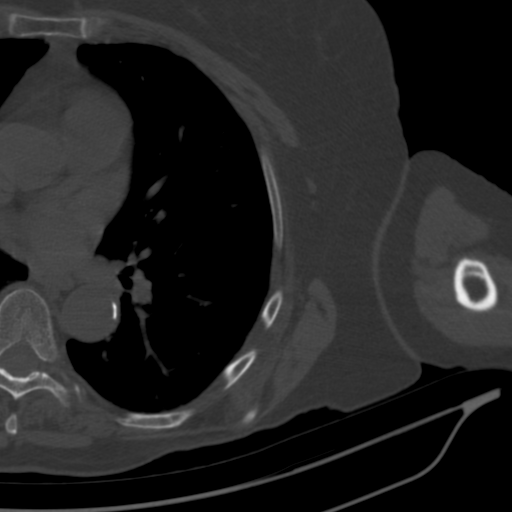
[im 13/83  bone]
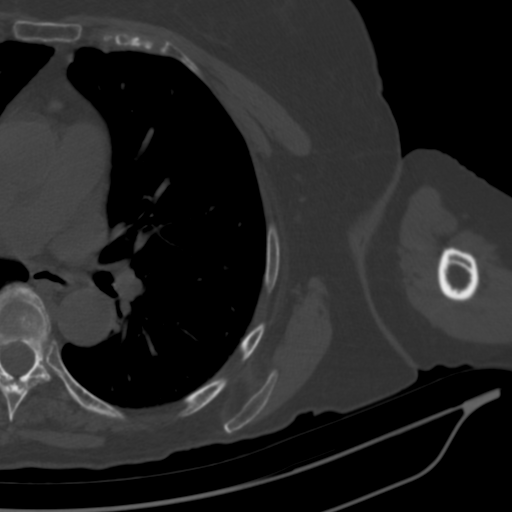
[im 19/83  bone]
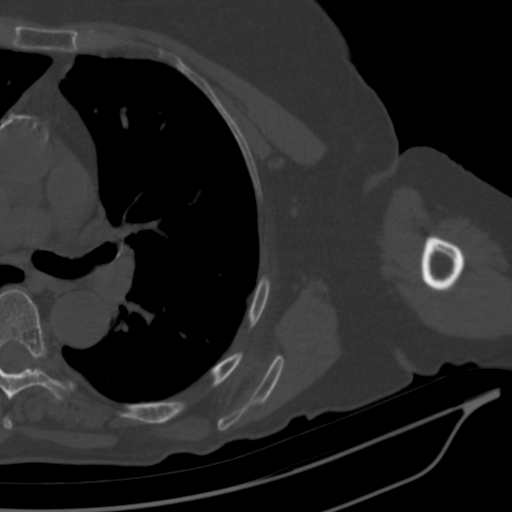
[im 26/83  bone]
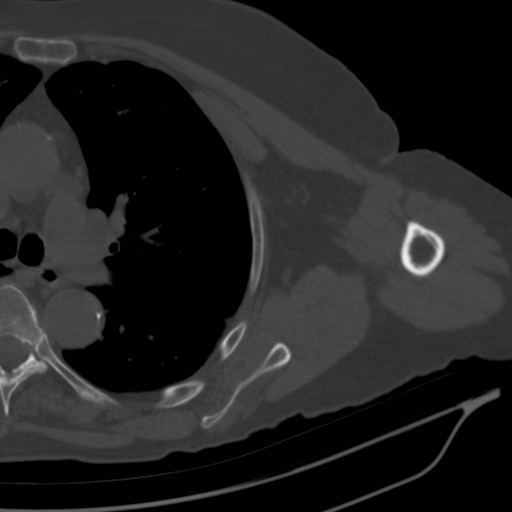
[im 32/83  soft-tissue]
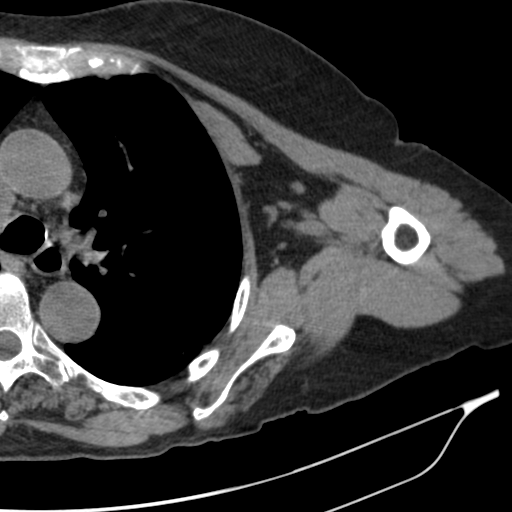
[im 32/83  bone]
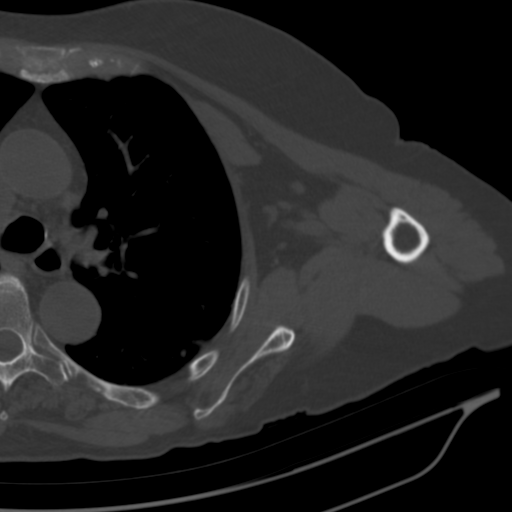
[im 45/83  bone]
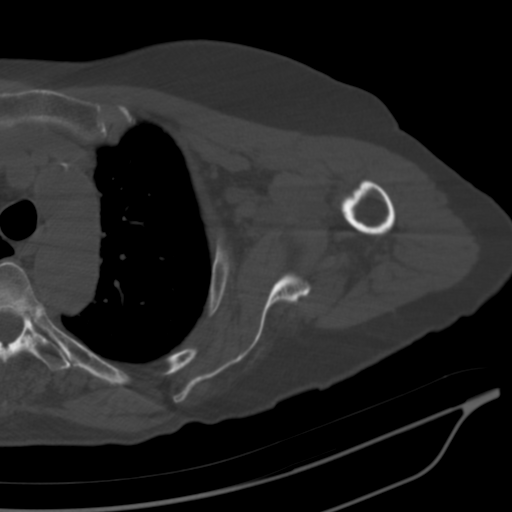
[im 51/83  bone]
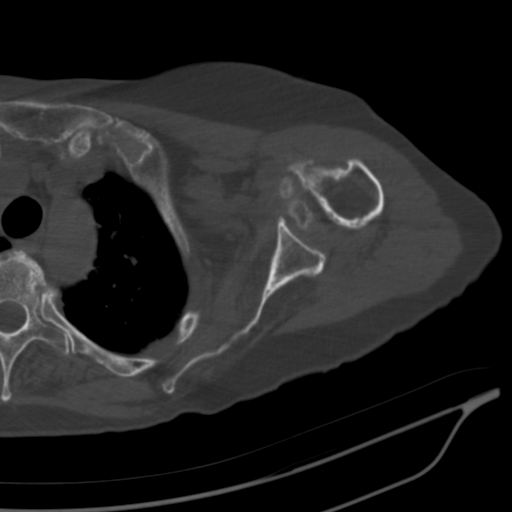
[im 57/83  bone]
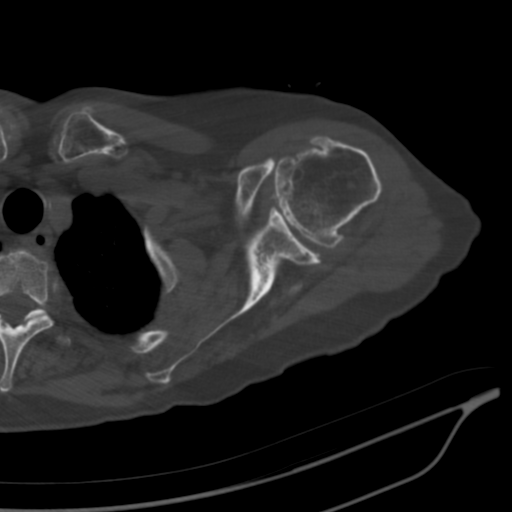
[im 64/83  soft-tissue]
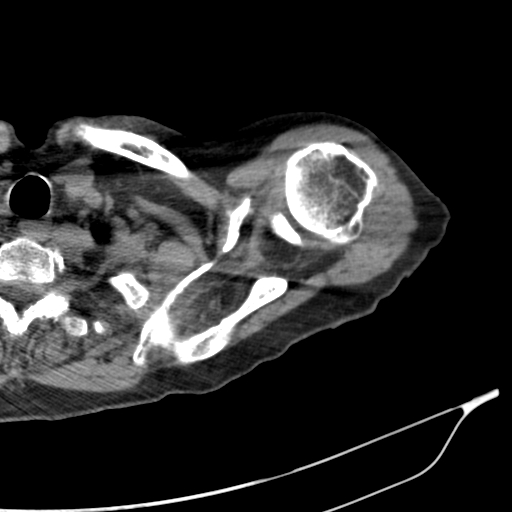
[im 64/83  bone]
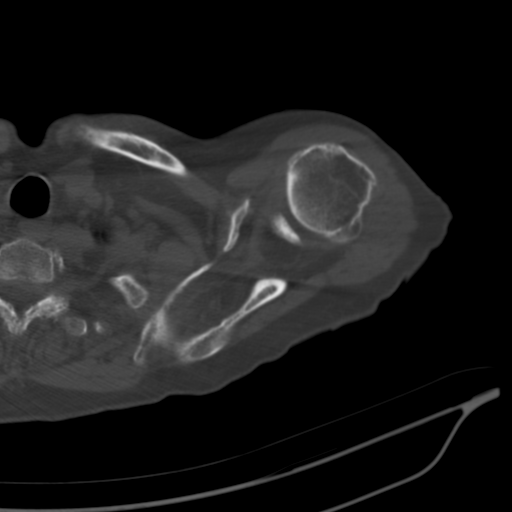
[im 70/83  bone]
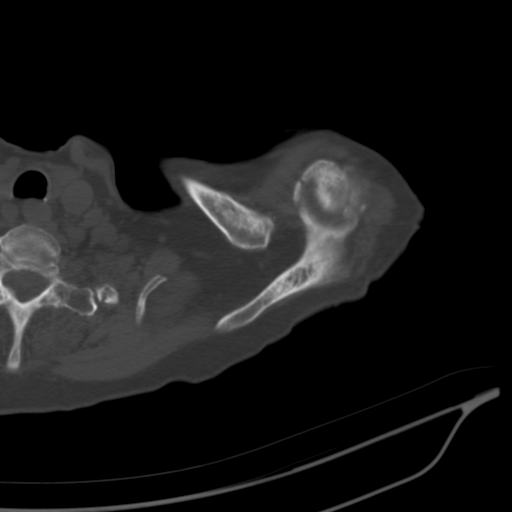
[im 76/83  bone]
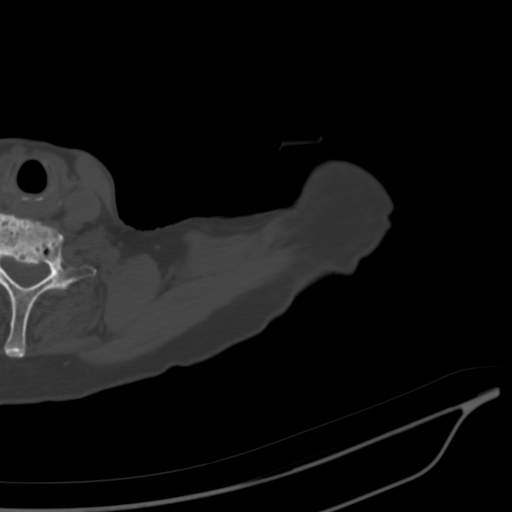

[Series 7: cor soft · coronal · 0.35mm/px · 3 of 112 slices shown]
[im 23/112  bone]
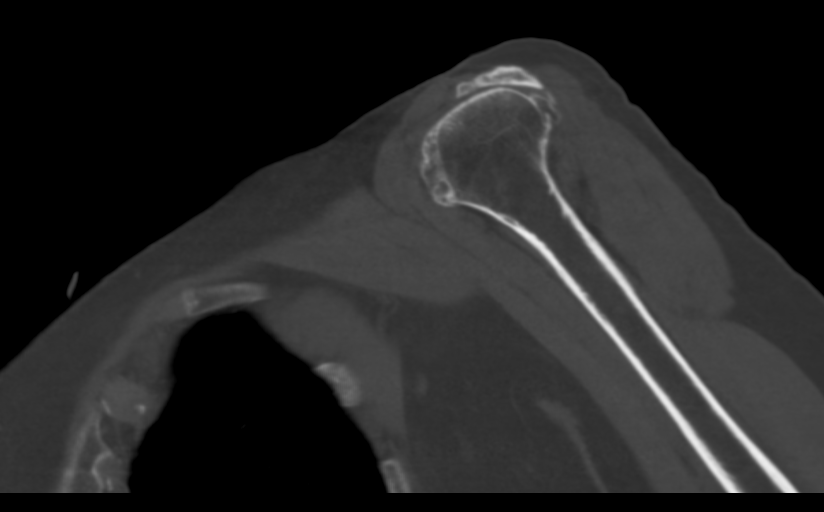
[im 45/112  bone]
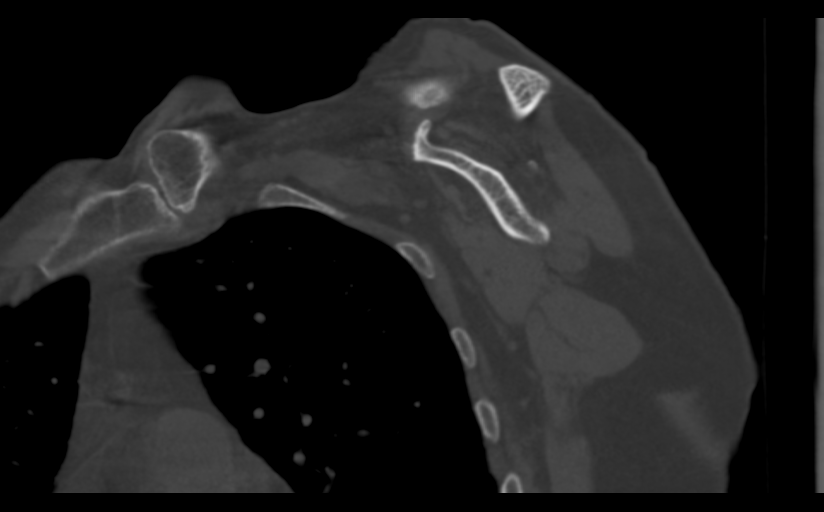
[im 67/112  bone]
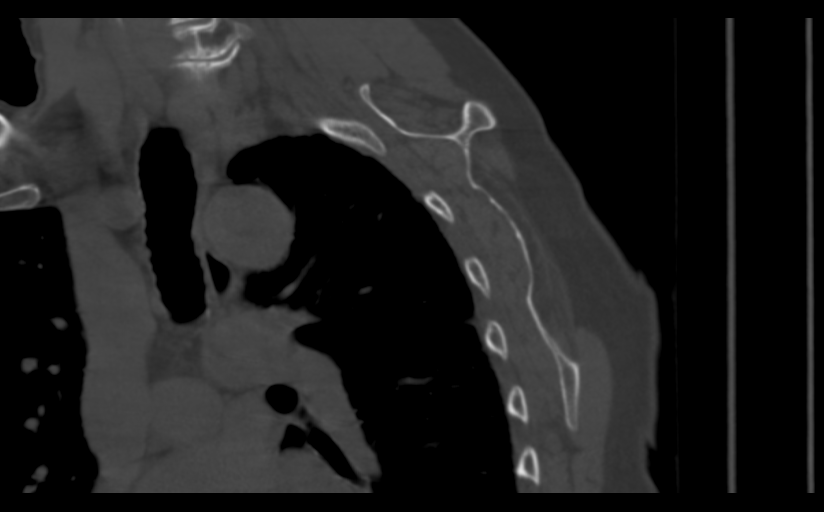

[14 of 20 positions shown; findings below may reference images not displayed]

FINDINGS: Advanced glenohumeral joint degenerative changes with joint space
narrowing, osteophytic spurring and subchondral cystic change.
Slight widening and thinning of the glenoid. The humeral head is
riding high in the glenoid fossa and is pseudo articulating with the
acromion. The acromion appears fragmented. Findings consistent with
a complete rotator cuff tear. The rotator cuff muscles demonstrate
fatty atrophy. There is also marked narrowing of the coracohumeral
space. Evidence of prior distal clavicle resection.

Emphysematous changes are noted in the left lung. No worrisome lung
lesions. The visualized ribs are intact. Aortic and coronary artery
calcifications are noted.
IMPRESSION: 1. Advanced glenohumeral joint degenerative changes.
2. Chronic rotator cuff tear with fatty atrophy of the muscles.
3. The humeral head is riding high in the glenoid fossa because of
the rotator cuff tear in the acromion appears fragmented.
4. Evidence of prior shoulder surgery with distal clavicle
resection.
5. Emphysematous changes noted in the left lung. Aortic and coronary
artery calcifications are noted.

## 2017-09-19 DIAGNOSIS — E7800 Pure hypercholesterolemia, unspecified: Secondary | ICD-10-CM | POA: Diagnosis not present

## 2017-09-19 DIAGNOSIS — E039 Hypothyroidism, unspecified: Secondary | ICD-10-CM | POA: Diagnosis not present

## 2017-09-19 DIAGNOSIS — H353131 Nonexudative age-related macular degeneration, bilateral, early dry stage: Secondary | ICD-10-CM | POA: Diagnosis not present

## 2017-09-19 DIAGNOSIS — I10 Essential (primary) hypertension: Secondary | ICD-10-CM | POA: Diagnosis not present

## 2017-09-19 DIAGNOSIS — Z961 Presence of intraocular lens: Secondary | ICD-10-CM | POA: Diagnosis not present

## 2017-09-19 DIAGNOSIS — H401231 Low-tension glaucoma, bilateral, mild stage: Secondary | ICD-10-CM | POA: Diagnosis not present

## 2017-09-22 DIAGNOSIS — E039 Hypothyroidism, unspecified: Secondary | ICD-10-CM | POA: Diagnosis not present

## 2017-09-22 DIAGNOSIS — F5101 Primary insomnia: Secondary | ICD-10-CM | POA: Diagnosis not present

## 2017-09-22 DIAGNOSIS — E7800 Pure hypercholesterolemia, unspecified: Secondary | ICD-10-CM | POA: Diagnosis not present

## 2017-09-22 DIAGNOSIS — M150 Primary generalized (osteo)arthritis: Secondary | ICD-10-CM | POA: Diagnosis not present

## 2017-09-22 DIAGNOSIS — I10 Essential (primary) hypertension: Secondary | ICD-10-CM | POA: Diagnosis not present

## 2017-09-26 DIAGNOSIS — X32XXXA Exposure to sunlight, initial encounter: Secondary | ICD-10-CM | POA: Diagnosis not present

## 2017-09-26 DIAGNOSIS — L821 Other seborrheic keratosis: Secondary | ICD-10-CM | POA: Diagnosis not present

## 2017-09-26 DIAGNOSIS — L570 Actinic keratosis: Secondary | ICD-10-CM | POA: Diagnosis not present

## 2017-09-26 DIAGNOSIS — L814 Other melanin hyperpigmentation: Secondary | ICD-10-CM | POA: Diagnosis not present

## 2017-09-26 DIAGNOSIS — L578 Other skin changes due to chronic exposure to nonionizing radiation: Secondary | ICD-10-CM | POA: Diagnosis not present

## 2017-09-26 DIAGNOSIS — M150 Primary generalized (osteo)arthritis: Secondary | ICD-10-CM | POA: Diagnosis not present

## 2017-11-22 DIAGNOSIS — E039 Hypothyroidism, unspecified: Secondary | ICD-10-CM | POA: Diagnosis not present

## 2018-05-23 DIAGNOSIS — Z23 Encounter for immunization: Secondary | ICD-10-CM | POA: Diagnosis not present

## 2018-06-02 DIAGNOSIS — M76822 Posterior tibial tendinitis, left leg: Secondary | ICD-10-CM | POA: Diagnosis not present

## 2018-06-12 DIAGNOSIS — E7800 Pure hypercholesterolemia, unspecified: Secondary | ICD-10-CM | POA: Diagnosis not present

## 2018-06-12 DIAGNOSIS — M150 Primary generalized (osteo)arthritis: Secondary | ICD-10-CM | POA: Diagnosis not present

## 2018-06-12 DIAGNOSIS — K219 Gastro-esophageal reflux disease without esophagitis: Secondary | ICD-10-CM | POA: Diagnosis not present

## 2018-06-12 DIAGNOSIS — I10 Essential (primary) hypertension: Secondary | ICD-10-CM | POA: Diagnosis not present

## 2018-06-12 DIAGNOSIS — M19012 Primary osteoarthritis, left shoulder: Secondary | ICD-10-CM | POA: Diagnosis not present

## 2018-06-12 DIAGNOSIS — F5101 Primary insomnia: Secondary | ICD-10-CM | POA: Diagnosis not present

## 2018-06-12 DIAGNOSIS — E039 Hypothyroidism, unspecified: Secondary | ICD-10-CM | POA: Diagnosis not present

## 2018-06-13 DIAGNOSIS — M2560 Stiffness of unspecified joint, not elsewhere classified: Secondary | ICD-10-CM | POA: Diagnosis not present

## 2018-06-13 DIAGNOSIS — M19079 Primary osteoarthritis, unspecified ankle and foot: Secondary | ICD-10-CM | POA: Diagnosis not present

## 2018-06-13 DIAGNOSIS — M76829 Posterior tibial tendinitis, unspecified leg: Secondary | ICD-10-CM | POA: Diagnosis not present

## 2018-06-13 DIAGNOSIS — R2689 Other abnormalities of gait and mobility: Secondary | ICD-10-CM | POA: Diagnosis not present

## 2018-06-13 DIAGNOSIS — R2681 Unsteadiness on feet: Secondary | ICD-10-CM | POA: Diagnosis not present

## 2018-06-14 DIAGNOSIS — N183 Chronic kidney disease, stage 3 (moderate): Secondary | ICD-10-CM | POA: Diagnosis not present

## 2018-06-15 DIAGNOSIS — M2560 Stiffness of unspecified joint, not elsewhere classified: Secondary | ICD-10-CM | POA: Diagnosis not present

## 2018-06-15 DIAGNOSIS — R2689 Other abnormalities of gait and mobility: Secondary | ICD-10-CM | POA: Diagnosis not present

## 2018-06-15 DIAGNOSIS — M19079 Primary osteoarthritis, unspecified ankle and foot: Secondary | ICD-10-CM | POA: Diagnosis not present

## 2018-06-15 DIAGNOSIS — M76829 Posterior tibial tendinitis, unspecified leg: Secondary | ICD-10-CM | POA: Diagnosis not present

## 2018-06-15 DIAGNOSIS — R2681 Unsteadiness on feet: Secondary | ICD-10-CM | POA: Diagnosis not present

## 2018-06-19 DIAGNOSIS — M76829 Posterior tibial tendinitis, unspecified leg: Secondary | ICD-10-CM | POA: Diagnosis not present

## 2018-06-19 DIAGNOSIS — M19079 Primary osteoarthritis, unspecified ankle and foot: Secondary | ICD-10-CM | POA: Diagnosis not present

## 2018-06-19 DIAGNOSIS — M2560 Stiffness of unspecified joint, not elsewhere classified: Secondary | ICD-10-CM | POA: Diagnosis not present

## 2018-06-19 DIAGNOSIS — R2689 Other abnormalities of gait and mobility: Secondary | ICD-10-CM | POA: Diagnosis not present

## 2018-06-19 DIAGNOSIS — R262 Difficulty in walking, not elsewhere classified: Secondary | ICD-10-CM | POA: Diagnosis not present

## 2018-06-21 DIAGNOSIS — R262 Difficulty in walking, not elsewhere classified: Secondary | ICD-10-CM | POA: Diagnosis not present

## 2018-06-21 DIAGNOSIS — M2560 Stiffness of unspecified joint, not elsewhere classified: Secondary | ICD-10-CM | POA: Diagnosis not present

## 2018-06-21 DIAGNOSIS — R2689 Other abnormalities of gait and mobility: Secondary | ICD-10-CM | POA: Diagnosis not present

## 2018-06-21 DIAGNOSIS — M19079 Primary osteoarthritis, unspecified ankle and foot: Secondary | ICD-10-CM | POA: Diagnosis not present

## 2018-06-21 DIAGNOSIS — M76829 Posterior tibial tendinitis, unspecified leg: Secondary | ICD-10-CM | POA: Diagnosis not present

## 2018-06-29 DIAGNOSIS — M2560 Stiffness of unspecified joint, not elsewhere classified: Secondary | ICD-10-CM | POA: Diagnosis not present

## 2018-06-29 DIAGNOSIS — R2689 Other abnormalities of gait and mobility: Secondary | ICD-10-CM | POA: Diagnosis not present

## 2018-06-29 DIAGNOSIS — M19079 Primary osteoarthritis, unspecified ankle and foot: Secondary | ICD-10-CM | POA: Diagnosis not present

## 2018-06-29 DIAGNOSIS — R262 Difficulty in walking, not elsewhere classified: Secondary | ICD-10-CM | POA: Diagnosis not present

## 2018-06-29 DIAGNOSIS — M76829 Posterior tibial tendinitis, unspecified leg: Secondary | ICD-10-CM | POA: Diagnosis not present

## 2018-06-30 DIAGNOSIS — M25512 Pain in left shoulder: Secondary | ICD-10-CM | POA: Diagnosis not present

## 2018-06-30 DIAGNOSIS — M76822 Posterior tibial tendinitis, left leg: Secondary | ICD-10-CM | POA: Diagnosis not present

## 2018-06-30 DIAGNOSIS — M25511 Pain in right shoulder: Secondary | ICD-10-CM | POA: Diagnosis not present

## 2018-07-03 DIAGNOSIS — R262 Difficulty in walking, not elsewhere classified: Secondary | ICD-10-CM | POA: Diagnosis not present

## 2018-07-03 DIAGNOSIS — M2560 Stiffness of unspecified joint, not elsewhere classified: Secondary | ICD-10-CM | POA: Diagnosis not present

## 2018-07-03 DIAGNOSIS — M19079 Primary osteoarthritis, unspecified ankle and foot: Secondary | ICD-10-CM | POA: Diagnosis not present

## 2018-07-03 DIAGNOSIS — M76829 Posterior tibial tendinitis, unspecified leg: Secondary | ICD-10-CM | POA: Diagnosis not present

## 2018-07-03 DIAGNOSIS — R2689 Other abnormalities of gait and mobility: Secondary | ICD-10-CM | POA: Diagnosis not present

## 2018-07-10 DIAGNOSIS — I10 Essential (primary) hypertension: Secondary | ICD-10-CM | POA: Diagnosis not present

## 2018-07-10 DIAGNOSIS — E039 Hypothyroidism, unspecified: Secondary | ICD-10-CM | POA: Diagnosis not present

## 2018-07-10 DIAGNOSIS — E7800 Pure hypercholesterolemia, unspecified: Secondary | ICD-10-CM | POA: Diagnosis not present

## 2018-10-27 DIAGNOSIS — M25512 Pain in left shoulder: Secondary | ICD-10-CM | POA: Diagnosis not present

## 2018-10-27 DIAGNOSIS — M25571 Pain in right ankle and joints of right foot: Secondary | ICD-10-CM | POA: Diagnosis not present

## 2018-10-27 DIAGNOSIS — M1711 Unilateral primary osteoarthritis, right knee: Secondary | ICD-10-CM | POA: Diagnosis not present

## 2018-11-06 DIAGNOSIS — M1711 Unilateral primary osteoarthritis, right knee: Secondary | ICD-10-CM | POA: Diagnosis not present

## 2018-11-13 DIAGNOSIS — M1711 Unilateral primary osteoarthritis, right knee: Secondary | ICD-10-CM | POA: Diagnosis not present

## 2018-12-07 DIAGNOSIS — Z961 Presence of intraocular lens: Secondary | ICD-10-CM | POA: Diagnosis not present

## 2018-12-07 DIAGNOSIS — H5202 Hypermetropia, left eye: Secondary | ICD-10-CM | POA: Diagnosis not present

## 2018-12-07 DIAGNOSIS — H5211 Myopia, right eye: Secondary | ICD-10-CM | POA: Diagnosis not present

## 2018-12-07 DIAGNOSIS — H401132 Primary open-angle glaucoma, bilateral, moderate stage: Secondary | ICD-10-CM | POA: Diagnosis not present

## 2019-02-12 DIAGNOSIS — M722 Plantar fascial fibromatosis: Secondary | ICD-10-CM | POA: Diagnosis not present

## 2019-02-12 DIAGNOSIS — M1711 Unilateral primary osteoarthritis, right knee: Secondary | ICD-10-CM | POA: Diagnosis not present

## 2019-03-08 DIAGNOSIS — H401131 Primary open-angle glaucoma, bilateral, mild stage: Secondary | ICD-10-CM | POA: Diagnosis not present

## 2019-03-19 DIAGNOSIS — E039 Hypothyroidism, unspecified: Secondary | ICD-10-CM | POA: Diagnosis not present

## 2019-03-19 DIAGNOSIS — I10 Essential (primary) hypertension: Secondary | ICD-10-CM | POA: Diagnosis not present

## 2019-03-19 DIAGNOSIS — E7800 Pure hypercholesterolemia, unspecified: Secondary | ICD-10-CM | POA: Diagnosis not present

## 2019-03-19 DIAGNOSIS — M545 Low back pain: Secondary | ICD-10-CM | POA: Diagnosis not present

## 2019-04-26 DIAGNOSIS — N3000 Acute cystitis without hematuria: Secondary | ICD-10-CM | POA: Diagnosis not present

## 2019-05-03 DIAGNOSIS — M76829 Posterior tibial tendinitis, unspecified leg: Secondary | ICD-10-CM | POA: Diagnosis not present

## 2019-05-03 DIAGNOSIS — M722 Plantar fascial fibromatosis: Secondary | ICD-10-CM | POA: Diagnosis not present

## 2019-05-03 DIAGNOSIS — R2689 Other abnormalities of gait and mobility: Secondary | ICD-10-CM | POA: Diagnosis not present

## 2019-05-03 DIAGNOSIS — M2569 Stiffness of other specified joint, not elsewhere classified: Secondary | ICD-10-CM | POA: Diagnosis not present

## 2019-05-03 DIAGNOSIS — R262 Difficulty in walking, not elsewhere classified: Secondary | ICD-10-CM | POA: Diagnosis not present

## 2019-05-08 DIAGNOSIS — R262 Difficulty in walking, not elsewhere classified: Secondary | ICD-10-CM | POA: Diagnosis not present

## 2019-05-08 DIAGNOSIS — M2569 Stiffness of other specified joint, not elsewhere classified: Secondary | ICD-10-CM | POA: Diagnosis not present

## 2019-05-08 DIAGNOSIS — M722 Plantar fascial fibromatosis: Secondary | ICD-10-CM | POA: Diagnosis not present

## 2019-05-08 DIAGNOSIS — R2689 Other abnormalities of gait and mobility: Secondary | ICD-10-CM | POA: Diagnosis not present

## 2019-05-08 DIAGNOSIS — M76829 Posterior tibial tendinitis, unspecified leg: Secondary | ICD-10-CM | POA: Diagnosis not present

## 2019-05-10 DIAGNOSIS — M76829 Posterior tibial tendinitis, unspecified leg: Secondary | ICD-10-CM | POA: Diagnosis not present

## 2019-05-10 DIAGNOSIS — M722 Plantar fascial fibromatosis: Secondary | ICD-10-CM | POA: Diagnosis not present

## 2019-05-10 DIAGNOSIS — R262 Difficulty in walking, not elsewhere classified: Secondary | ICD-10-CM | POA: Diagnosis not present

## 2019-05-10 DIAGNOSIS — R2689 Other abnormalities of gait and mobility: Secondary | ICD-10-CM | POA: Diagnosis not present

## 2019-05-10 DIAGNOSIS — M2569 Stiffness of other specified joint, not elsewhere classified: Secondary | ICD-10-CM | POA: Diagnosis not present

## 2019-05-14 DIAGNOSIS — M722 Plantar fascial fibromatosis: Secondary | ICD-10-CM | POA: Diagnosis not present

## 2019-05-14 DIAGNOSIS — R2689 Other abnormalities of gait and mobility: Secondary | ICD-10-CM | POA: Diagnosis not present

## 2019-05-14 DIAGNOSIS — R262 Difficulty in walking, not elsewhere classified: Secondary | ICD-10-CM | POA: Diagnosis not present

## 2019-05-14 DIAGNOSIS — M2569 Stiffness of other specified joint, not elsewhere classified: Secondary | ICD-10-CM | POA: Diagnosis not present

## 2019-05-14 DIAGNOSIS — M76829 Posterior tibial tendinitis, unspecified leg: Secondary | ICD-10-CM | POA: Diagnosis not present

## 2019-05-17 DIAGNOSIS — M722 Plantar fascial fibromatosis: Secondary | ICD-10-CM | POA: Diagnosis not present

## 2019-05-17 DIAGNOSIS — R262 Difficulty in walking, not elsewhere classified: Secondary | ICD-10-CM | POA: Diagnosis not present

## 2019-05-17 DIAGNOSIS — M2569 Stiffness of other specified joint, not elsewhere classified: Secondary | ICD-10-CM | POA: Diagnosis not present

## 2019-05-17 DIAGNOSIS — R2689 Other abnormalities of gait and mobility: Secondary | ICD-10-CM | POA: Diagnosis not present

## 2019-05-17 DIAGNOSIS — M76829 Posterior tibial tendinitis, unspecified leg: Secondary | ICD-10-CM | POA: Diagnosis not present

## 2019-05-24 DIAGNOSIS — M542 Cervicalgia: Secondary | ICD-10-CM | POA: Diagnosis not present

## 2019-05-24 DIAGNOSIS — R262 Difficulty in walking, not elsewhere classified: Secondary | ICD-10-CM | POA: Diagnosis not present

## 2019-05-24 DIAGNOSIS — R2689 Other abnormalities of gait and mobility: Secondary | ICD-10-CM | POA: Diagnosis not present

## 2019-05-24 DIAGNOSIS — M19079 Primary osteoarthritis, unspecified ankle and foot: Secondary | ICD-10-CM | POA: Diagnosis not present

## 2019-05-24 DIAGNOSIS — M436 Torticollis: Secondary | ICD-10-CM | POA: Diagnosis not present

## 2019-05-24 DIAGNOSIS — M76829 Posterior tibial tendinitis, unspecified leg: Secondary | ICD-10-CM | POA: Diagnosis not present

## 2019-05-24 DIAGNOSIS — M2569 Stiffness of other specified joint, not elsewhere classified: Secondary | ICD-10-CM | POA: Diagnosis not present

## 2019-05-28 DIAGNOSIS — M19079 Primary osteoarthritis, unspecified ankle and foot: Secondary | ICD-10-CM | POA: Diagnosis not present

## 2019-05-28 DIAGNOSIS — M436 Torticollis: Secondary | ICD-10-CM | POA: Diagnosis not present

## 2019-05-28 DIAGNOSIS — M542 Cervicalgia: Secondary | ICD-10-CM | POA: Diagnosis not present

## 2019-05-28 DIAGNOSIS — M76829 Posterior tibial tendinitis, unspecified leg: Secondary | ICD-10-CM | POA: Diagnosis not present

## 2019-05-28 DIAGNOSIS — M2569 Stiffness of other specified joint, not elsewhere classified: Secondary | ICD-10-CM | POA: Diagnosis not present

## 2019-05-28 DIAGNOSIS — R2689 Other abnormalities of gait and mobility: Secondary | ICD-10-CM | POA: Diagnosis not present

## 2019-05-30 DIAGNOSIS — N1831 Chronic kidney disease, stage 3a: Secondary | ICD-10-CM | POA: Diagnosis not present

## 2019-05-30 DIAGNOSIS — E039 Hypothyroidism, unspecified: Secondary | ICD-10-CM | POA: Diagnosis not present

## 2019-06-05 DIAGNOSIS — R2689 Other abnormalities of gait and mobility: Secondary | ICD-10-CM | POA: Diagnosis not present

## 2019-06-05 DIAGNOSIS — M2569 Stiffness of other specified joint, not elsewhere classified: Secondary | ICD-10-CM | POA: Diagnosis not present

## 2019-06-05 DIAGNOSIS — M19079 Primary osteoarthritis, unspecified ankle and foot: Secondary | ICD-10-CM | POA: Diagnosis not present

## 2019-06-05 DIAGNOSIS — M542 Cervicalgia: Secondary | ICD-10-CM | POA: Diagnosis not present

## 2019-06-05 DIAGNOSIS — M436 Torticollis: Secondary | ICD-10-CM | POA: Diagnosis not present

## 2019-06-05 DIAGNOSIS — M76829 Posterior tibial tendinitis, unspecified leg: Secondary | ICD-10-CM | POA: Diagnosis not present

## 2019-06-12 DIAGNOSIS — M542 Cervicalgia: Secondary | ICD-10-CM | POA: Diagnosis not present

## 2019-06-12 DIAGNOSIS — M76829 Posterior tibial tendinitis, unspecified leg: Secondary | ICD-10-CM | POA: Diagnosis not present

## 2019-06-12 DIAGNOSIS — M2569 Stiffness of other specified joint, not elsewhere classified: Secondary | ICD-10-CM | POA: Diagnosis not present

## 2019-06-12 DIAGNOSIS — R2689 Other abnormalities of gait and mobility: Secondary | ICD-10-CM | POA: Diagnosis not present

## 2019-06-12 DIAGNOSIS — M436 Torticollis: Secondary | ICD-10-CM | POA: Diagnosis not present

## 2019-06-12 DIAGNOSIS — M19079 Primary osteoarthritis, unspecified ankle and foot: Secondary | ICD-10-CM | POA: Diagnosis not present

## 2019-06-19 DIAGNOSIS — M542 Cervicalgia: Secondary | ICD-10-CM | POA: Diagnosis not present

## 2019-06-19 DIAGNOSIS — M2569 Stiffness of other specified joint, not elsewhere classified: Secondary | ICD-10-CM | POA: Diagnosis not present

## 2019-06-19 DIAGNOSIS — R2689 Other abnormalities of gait and mobility: Secondary | ICD-10-CM | POA: Diagnosis not present

## 2019-06-19 DIAGNOSIS — M76829 Posterior tibial tendinitis, unspecified leg: Secondary | ICD-10-CM | POA: Diagnosis not present

## 2019-06-19 DIAGNOSIS — M436 Torticollis: Secondary | ICD-10-CM | POA: Diagnosis not present

## 2019-06-29 DIAGNOSIS — M1711 Unilateral primary osteoarthritis, right knee: Secondary | ICD-10-CM | POA: Diagnosis not present

## 2019-06-29 DIAGNOSIS — M25572 Pain in left ankle and joints of left foot: Secondary | ICD-10-CM | POA: Diagnosis not present

## 2019-07-06 DIAGNOSIS — M1711 Unilateral primary osteoarthritis, right knee: Secondary | ICD-10-CM | POA: Diagnosis not present

## 2019-07-10 DIAGNOSIS — K219 Gastro-esophageal reflux disease without esophagitis: Secondary | ICD-10-CM | POA: Diagnosis not present

## 2019-07-10 DIAGNOSIS — R3915 Urgency of urination: Secondary | ICD-10-CM | POA: Diagnosis not present

## 2019-07-10 DIAGNOSIS — R1030 Lower abdominal pain, unspecified: Secondary | ICD-10-CM | POA: Diagnosis not present

## 2019-07-10 DIAGNOSIS — K640 First degree hemorrhoids: Secondary | ICD-10-CM | POA: Diagnosis not present

## 2019-07-13 DIAGNOSIS — M1711 Unilateral primary osteoarthritis, right knee: Secondary | ICD-10-CM | POA: Diagnosis not present

## 2019-08-22 DIAGNOSIS — I10 Essential (primary) hypertension: Secondary | ICD-10-CM | POA: Diagnosis not present

## 2019-08-22 DIAGNOSIS — K625 Hemorrhage of anus and rectum: Secondary | ICD-10-CM | POA: Diagnosis not present

## 2019-08-22 DIAGNOSIS — E039 Hypothyroidism, unspecified: Secondary | ICD-10-CM | POA: Diagnosis not present

## 2019-08-24 DIAGNOSIS — K625 Hemorrhage of anus and rectum: Secondary | ICD-10-CM | POA: Diagnosis not present

## 2019-08-24 DIAGNOSIS — R1030 Lower abdominal pain, unspecified: Secondary | ICD-10-CM | POA: Diagnosis not present

## 2019-08-24 DIAGNOSIS — K5900 Constipation, unspecified: Secondary | ICD-10-CM | POA: Diagnosis not present

## 2019-08-29 ENCOUNTER — Other Ambulatory Visit: Payer: Self-pay | Admitting: Physician Assistant

## 2019-08-29 ENCOUNTER — Other Ambulatory Visit: Payer: Self-pay | Admitting: Hematology and Oncology

## 2019-08-29 DIAGNOSIS — R1030 Lower abdominal pain, unspecified: Secondary | ICD-10-CM

## 2019-08-29 DIAGNOSIS — K625 Hemorrhage of anus and rectum: Secondary | ICD-10-CM

## 2019-09-10 DIAGNOSIS — H524 Presbyopia: Secondary | ICD-10-CM | POA: Diagnosis not present

## 2019-09-10 DIAGNOSIS — H353132 Nonexudative age-related macular degeneration, bilateral, intermediate dry stage: Secondary | ICD-10-CM | POA: Diagnosis not present

## 2019-09-10 DIAGNOSIS — H401131 Primary open-angle glaucoma, bilateral, mild stage: Secondary | ICD-10-CM | POA: Diagnosis not present

## 2019-09-14 ENCOUNTER — Ambulatory Visit
Admission: RE | Admit: 2019-09-14 | Discharge: 2019-09-14 | Disposition: A | Discharge status: Home / Self Care | Payer: Medicare Other | Source: Ambulatory Visit | Attending: Physician Assistant | Admitting: Physician Assistant

## 2019-09-14 ENCOUNTER — Other Ambulatory Visit: Payer: Self-pay

## 2019-09-14 DIAGNOSIS — K625 Hemorrhage of anus and rectum: Secondary | ICD-10-CM

## 2019-09-14 DIAGNOSIS — R1030 Lower abdominal pain, unspecified: Secondary | ICD-10-CM

## 2019-09-14 DIAGNOSIS — K5900 Constipation, unspecified: Secondary | ICD-10-CM | POA: Diagnosis not present

## 2019-09-14 MED ORDER — IOPAMIDOL (ISOVUE-300) INJECTION 61%
100.0000 mL | Freq: Once | INTRAVENOUS | Status: AC | PRN
  Administered 2019-09-14: 100 mL via INTRAVENOUS

## 2019-10-10 DIAGNOSIS — I10 Essential (primary) hypertension: Secondary | ICD-10-CM | POA: Diagnosis not present

## 2019-10-10 DIAGNOSIS — E039 Hypothyroidism, unspecified: Secondary | ICD-10-CM | POA: Diagnosis not present

## 2019-10-10 DIAGNOSIS — E7800 Pure hypercholesterolemia, unspecified: Secondary | ICD-10-CM | POA: Diagnosis not present

## 2019-11-07 DIAGNOSIS — H353221 Exudative age-related macular degeneration, left eye, with active choroidal neovascularization: Secondary | ICD-10-CM | POA: Diagnosis not present

## 2019-11-07 DIAGNOSIS — H43813 Vitreous degeneration, bilateral: Secondary | ICD-10-CM | POA: Diagnosis not present

## 2019-11-07 DIAGNOSIS — H3562 Retinal hemorrhage, left eye: Secondary | ICD-10-CM | POA: Diagnosis not present

## 2019-11-07 DIAGNOSIS — H353111 Nonexudative age-related macular degeneration, right eye, early dry stage: Secondary | ICD-10-CM | POA: Diagnosis not present

## 2019-11-08 DIAGNOSIS — H353221 Exudative age-related macular degeneration, left eye, with active choroidal neovascularization: Secondary | ICD-10-CM | POA: Diagnosis not present

## 2019-11-08 DIAGNOSIS — H35732 Hemorrhagic detachment of retinal pigment epithelium, left eye: Secondary | ICD-10-CM | POA: Diagnosis not present

## 2019-11-08 DIAGNOSIS — H3562 Retinal hemorrhage, left eye: Secondary | ICD-10-CM | POA: Diagnosis not present

## 2019-11-09 DIAGNOSIS — H3562 Retinal hemorrhage, left eye: Secondary | ICD-10-CM | POA: Diagnosis not present

## 2019-11-20 DIAGNOSIS — H353111 Nonexudative age-related macular degeneration, right eye, early dry stage: Secondary | ICD-10-CM | POA: Diagnosis not present

## 2019-11-20 DIAGNOSIS — H353221 Exudative age-related macular degeneration, left eye, with active choroidal neovascularization: Secondary | ICD-10-CM | POA: Diagnosis not present

## 2019-12-11 DIAGNOSIS — H353221 Exudative age-related macular degeneration, left eye, with active choroidal neovascularization: Secondary | ICD-10-CM | POA: Diagnosis not present

## 2019-12-11 DIAGNOSIS — H353111 Nonexudative age-related macular degeneration, right eye, early dry stage: Secondary | ICD-10-CM | POA: Diagnosis not present

## 2019-12-11 DIAGNOSIS — H3562 Retinal hemorrhage, left eye: Secondary | ICD-10-CM | POA: Diagnosis not present

## 2020-01-08 DIAGNOSIS — H353111 Nonexudative age-related macular degeneration, right eye, early dry stage: Secondary | ICD-10-CM | POA: Diagnosis not present

## 2020-01-08 DIAGNOSIS — H353221 Exudative age-related macular degeneration, left eye, with active choroidal neovascularization: Secondary | ICD-10-CM | POA: Diagnosis not present

## 2020-01-15 DIAGNOSIS — H3562 Retinal hemorrhage, left eye: Secondary | ICD-10-CM | POA: Diagnosis not present

## 2020-01-15 DIAGNOSIS — H04122 Dry eye syndrome of left lacrimal gland: Secondary | ICD-10-CM | POA: Diagnosis not present

## 2020-02-01 DIAGNOSIS — Z8616 Personal history of COVID-19: Secondary | ICD-10-CM | POA: Diagnosis not present

## 2020-02-19 DIAGNOSIS — H353221 Exudative age-related macular degeneration, left eye, with active choroidal neovascularization: Secondary | ICD-10-CM | POA: Diagnosis not present

## 2020-02-19 DIAGNOSIS — H353111 Nonexudative age-related macular degeneration, right eye, early dry stage: Secondary | ICD-10-CM | POA: Diagnosis not present

## 2020-02-19 DIAGNOSIS — H3562 Retinal hemorrhage, left eye: Secondary | ICD-10-CM | POA: Diagnosis not present

## 2020-02-19 DIAGNOSIS — H43812 Vitreous degeneration, left eye: Secondary | ICD-10-CM | POA: Diagnosis not present

## 2020-02-25 DIAGNOSIS — M1811 Unilateral primary osteoarthritis of first carpometacarpal joint, right hand: Secondary | ICD-10-CM | POA: Diagnosis not present

## 2020-02-25 DIAGNOSIS — M1711 Unilateral primary osteoarthritis, right knee: Secondary | ICD-10-CM | POA: Diagnosis not present

## 2020-03-03 DIAGNOSIS — M1711 Unilateral primary osteoarthritis, right knee: Secondary | ICD-10-CM | POA: Diagnosis not present

## 2020-03-10 DIAGNOSIS — M1711 Unilateral primary osteoarthritis, right knee: Secondary | ICD-10-CM | POA: Diagnosis not present

## 2020-03-11 DIAGNOSIS — M67432 Ganglion, left wrist: Secondary | ICD-10-CM | POA: Diagnosis not present

## 2020-03-11 DIAGNOSIS — M1812 Unilateral primary osteoarthritis of first carpometacarpal joint, left hand: Secondary | ICD-10-CM | POA: Diagnosis not present

## 2020-03-11 DIAGNOSIS — M19032 Primary osteoarthritis, left wrist: Secondary | ICD-10-CM | POA: Diagnosis not present

## 2020-03-11 DIAGNOSIS — G8929 Other chronic pain: Secondary | ICD-10-CM | POA: Diagnosis not present

## 2020-03-11 DIAGNOSIS — M6740 Ganglion, unspecified site: Secondary | ICD-10-CM | POA: Diagnosis not present

## 2020-03-11 DIAGNOSIS — M79644 Pain in right finger(s): Secondary | ICD-10-CM | POA: Diagnosis not present

## 2020-03-17 DIAGNOSIS — H353221 Exudative age-related macular degeneration, left eye, with active choroidal neovascularization: Secondary | ICD-10-CM | POA: Diagnosis not present

## 2020-03-17 DIAGNOSIS — H401131 Primary open-angle glaucoma, bilateral, mild stage: Secondary | ICD-10-CM | POA: Diagnosis not present

## 2020-03-24 DIAGNOSIS — I10 Essential (primary) hypertension: Secondary | ICD-10-CM | POA: Diagnosis not present

## 2020-03-24 DIAGNOSIS — E7800 Pure hypercholesterolemia, unspecified: Secondary | ICD-10-CM | POA: Diagnosis not present

## 2020-03-24 DIAGNOSIS — Z0000 Encounter for general adult medical examination without abnormal findings: Secondary | ICD-10-CM | POA: Diagnosis not present

## 2020-03-24 DIAGNOSIS — E039 Hypothyroidism, unspecified: Secondary | ICD-10-CM | POA: Diagnosis not present

## 2020-03-24 DIAGNOSIS — K640 First degree hemorrhoids: Secondary | ICD-10-CM | POA: Diagnosis not present

## 2020-03-24 DIAGNOSIS — K219 Gastro-esophageal reflux disease without esophagitis: Secondary | ICD-10-CM | POA: Diagnosis not present

## 2020-04-15 DIAGNOSIS — H353221 Exudative age-related macular degeneration, left eye, with active choroidal neovascularization: Secondary | ICD-10-CM | POA: Diagnosis not present

## 2020-04-15 DIAGNOSIS — H31012 Macula scars of posterior pole (postinflammatory) (post-traumatic), left eye: Secondary | ICD-10-CM | POA: Diagnosis not present

## 2020-04-15 DIAGNOSIS — H353111 Nonexudative age-related macular degeneration, right eye, early dry stage: Secondary | ICD-10-CM | POA: Diagnosis not present

## 2020-04-15 DIAGNOSIS — H43811 Vitreous degeneration, right eye: Secondary | ICD-10-CM | POA: Diagnosis not present

## 2020-07-15 DIAGNOSIS — H353221 Exudative age-related macular degeneration, left eye, with active choroidal neovascularization: Secondary | ICD-10-CM | POA: Diagnosis not present

## 2020-07-15 DIAGNOSIS — H353111 Nonexudative age-related macular degeneration, right eye, early dry stage: Secondary | ICD-10-CM | POA: Diagnosis not present

## 2020-07-15 DIAGNOSIS — H43811 Vitreous degeneration, right eye: Secondary | ICD-10-CM | POA: Diagnosis not present

## 2020-08-21 DIAGNOSIS — L308 Other specified dermatitis: Secondary | ICD-10-CM | POA: Diagnosis not present

## 2020-08-21 DIAGNOSIS — L821 Other seborrheic keratosis: Secondary | ICD-10-CM | POA: Diagnosis not present

## 2020-08-21 DIAGNOSIS — L578 Other skin changes due to chronic exposure to nonionizing radiation: Secondary | ICD-10-CM | POA: Diagnosis not present

## 2020-08-21 DIAGNOSIS — D1801 Hemangioma of skin and subcutaneous tissue: Secondary | ICD-10-CM | POA: Diagnosis not present

## 2020-08-21 DIAGNOSIS — L570 Actinic keratosis: Secondary | ICD-10-CM | POA: Diagnosis not present

## 2020-08-21 DIAGNOSIS — L814 Other melanin hyperpigmentation: Secondary | ICD-10-CM | POA: Diagnosis not present

## 2020-09-19 DIAGNOSIS — H401131 Primary open-angle glaucoma, bilateral, mild stage: Secondary | ICD-10-CM | POA: Diagnosis not present

## 2020-09-25 DIAGNOSIS — E039 Hypothyroidism, unspecified: Secondary | ICD-10-CM | POA: Diagnosis not present

## 2020-09-25 DIAGNOSIS — N1831 Chronic kidney disease, stage 3a: Secondary | ICD-10-CM | POA: Diagnosis not present

## 2020-09-25 DIAGNOSIS — M150 Primary generalized (osteo)arthritis: Secondary | ICD-10-CM | POA: Diagnosis not present

## 2020-09-25 DIAGNOSIS — I10 Essential (primary) hypertension: Secondary | ICD-10-CM | POA: Diagnosis not present

## 2020-09-25 DIAGNOSIS — R252 Cramp and spasm: Secondary | ICD-10-CM | POA: Diagnosis not present

## 2020-09-25 DIAGNOSIS — E7800 Pure hypercholesterolemia, unspecified: Secondary | ICD-10-CM | POA: Diagnosis not present

## 2020-09-26 DIAGNOSIS — R252 Cramp and spasm: Secondary | ICD-10-CM | POA: Diagnosis not present

## 2020-09-29 DIAGNOSIS — M7061 Trochanteric bursitis, right hip: Secondary | ICD-10-CM | POA: Diagnosis not present

## 2020-09-29 DIAGNOSIS — M48061 Spinal stenosis, lumbar region without neurogenic claudication: Secondary | ICD-10-CM | POA: Diagnosis not present

## 2020-09-29 DIAGNOSIS — M1711 Unilateral primary osteoarthritis, right knee: Secondary | ICD-10-CM | POA: Diagnosis not present

## 2020-10-09 DIAGNOSIS — L570 Actinic keratosis: Secondary | ICD-10-CM | POA: Diagnosis not present

## 2020-11-04 DIAGNOSIS — H43811 Vitreous degeneration, right eye: Secondary | ICD-10-CM | POA: Diagnosis not present

## 2020-11-04 DIAGNOSIS — H353111 Nonexudative age-related macular degeneration, right eye, early dry stage: Secondary | ICD-10-CM | POA: Diagnosis not present

## 2020-11-04 DIAGNOSIS — H353221 Exudative age-related macular degeneration, left eye, with active choroidal neovascularization: Secondary | ICD-10-CM | POA: Diagnosis not present

## 2020-11-05 DIAGNOSIS — M1611 Unilateral primary osteoarthritis, right hip: Secondary | ICD-10-CM | POA: Diagnosis not present

## 2020-11-05 DIAGNOSIS — M1711 Unilateral primary osteoarthritis, right knee: Secondary | ICD-10-CM | POA: Diagnosis not present

## 2020-11-12 DIAGNOSIS — M1711 Unilateral primary osteoarthritis, right knee: Secondary | ICD-10-CM | POA: Diagnosis not present

## 2020-11-19 DIAGNOSIS — M1711 Unilateral primary osteoarthritis, right knee: Secondary | ICD-10-CM | POA: Diagnosis not present

## 2021-01-02 DIAGNOSIS — K130 Diseases of lips: Secondary | ICD-10-CM | POA: Diagnosis not present

## 2021-01-02 DIAGNOSIS — J40 Bronchitis, not specified as acute or chronic: Secondary | ICD-10-CM | POA: Diagnosis not present

## 2021-01-02 DIAGNOSIS — I10 Essential (primary) hypertension: Secondary | ICD-10-CM | POA: Diagnosis not present

## 2021-01-18 IMAGING — CT CT ABD-PELV W/ CM
1 of 3 series · 13 of 32 positions shown, 18 images · IV contrast (iopamidol)
Comparison: CT chest 12/14/2015, CT abdomen June 23, 2011

CLINICAL DATA: Diarrhea follow-up by constipation. Lower abdominal
pain.

EXAM:
CT ABDOMEN AND PELVIS WITH CONTRAST
TECHNIQUE: Multidetector CT imaging of the abdomen and pelvis was performed
using the standard protocol following bolus administration of
intravenous contrast.
CONTRAST:  100mL M5O4X6-BEE IOPAMIDOL (M5O4X6-BEE) INJECTION 61%

[Series 2: abd/pelvis w/cm · axial · 0.72mm/px · z∈[+620,+975]mm · 13 of 83 slices shown, 18 images]
[im 6/83  soft-tissue]
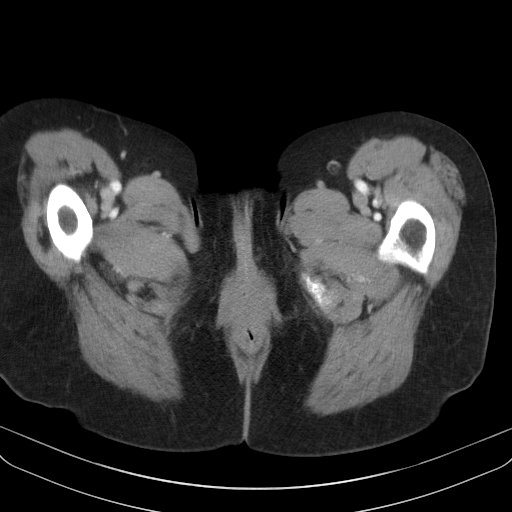
[im 6/83  bone]
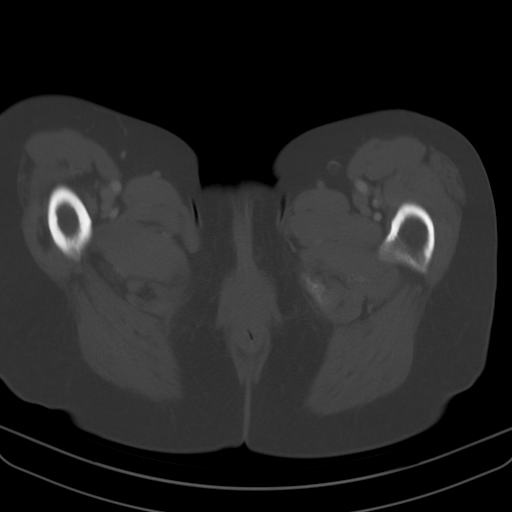
[im 11/83  soft-tissue]
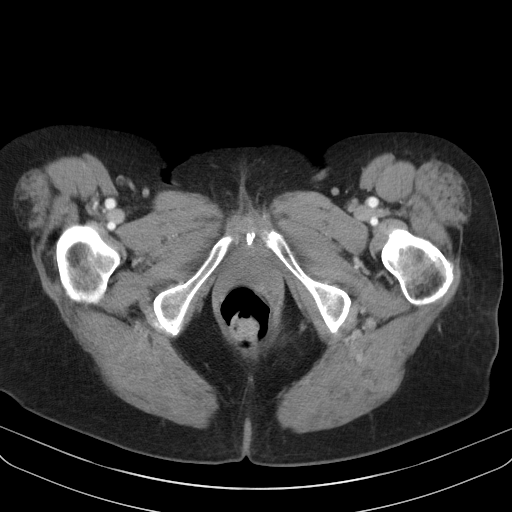
[im 17/83  soft-tissue]
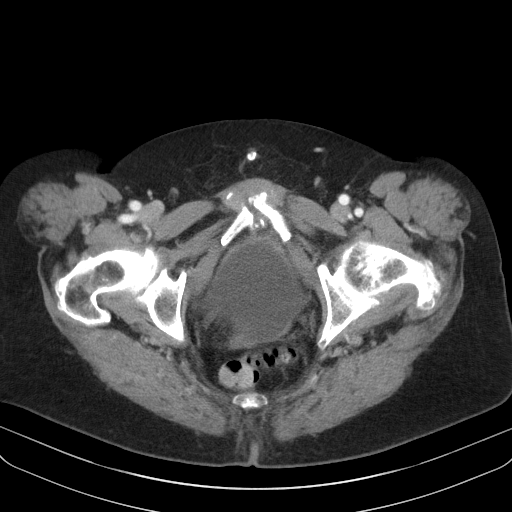
[im 28/83  soft-tissue]
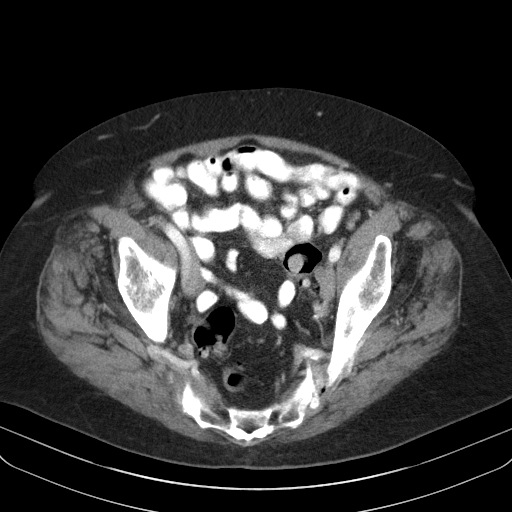
[im 33/83  soft-tissue]
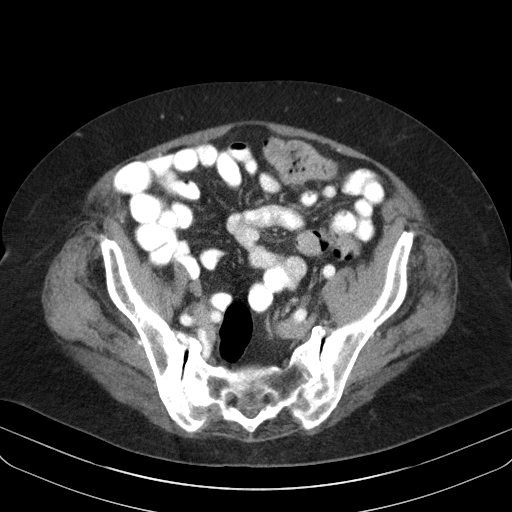
[im 39/83  soft-tissue]
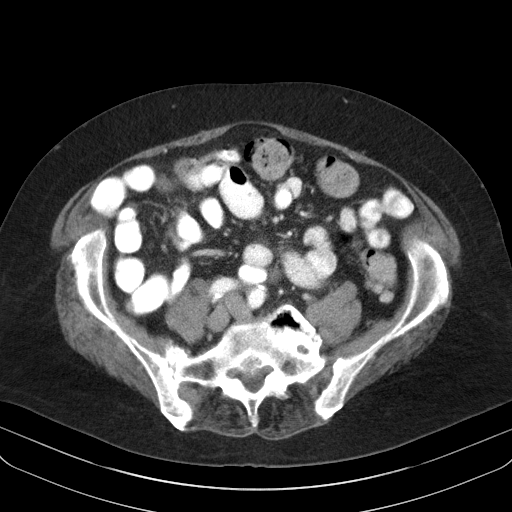
[im 44/83  soft-tissue]
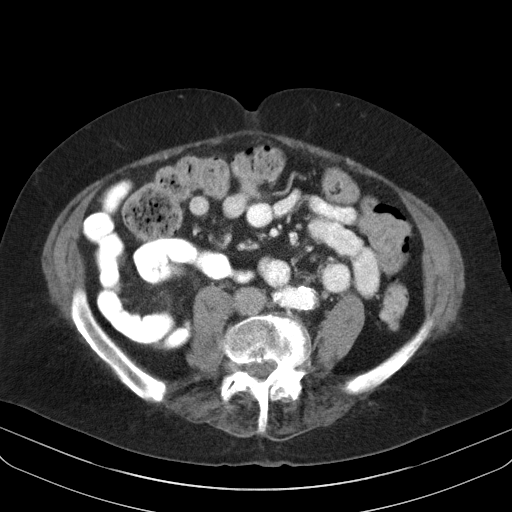
[im 50/83  soft-tissue]
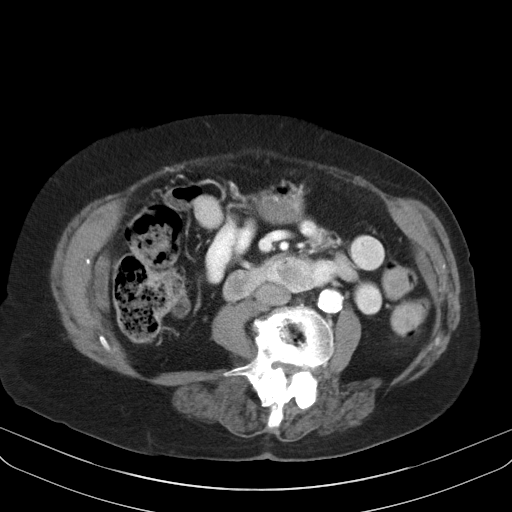
[im 55/83  soft-tissue]
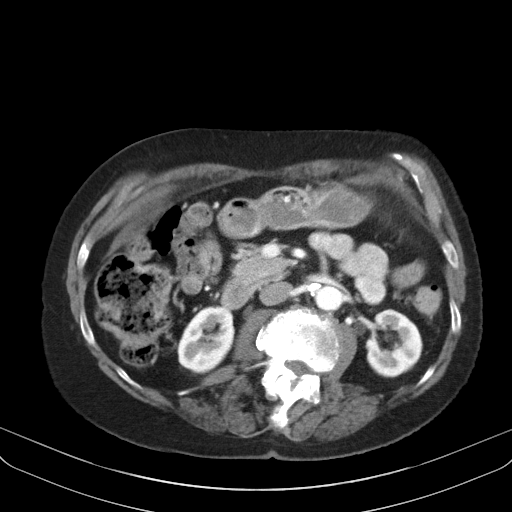
[im 55/83  bone]
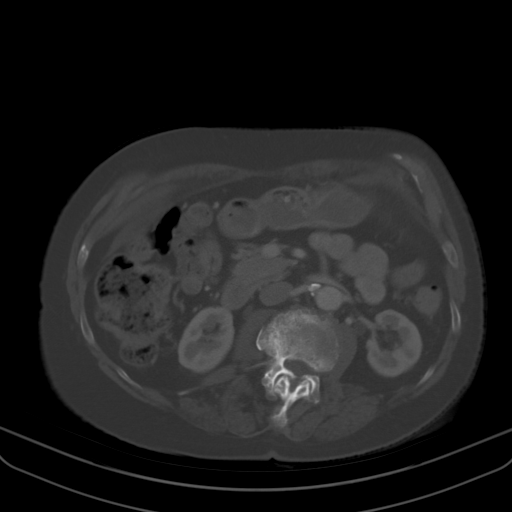
[im 61/83  lung]
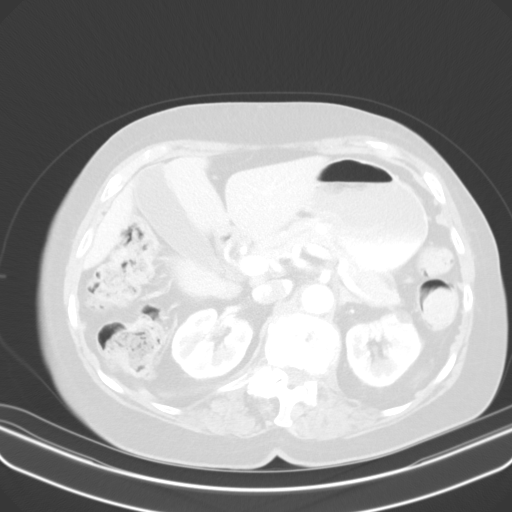
[im 66/83  soft-tissue]
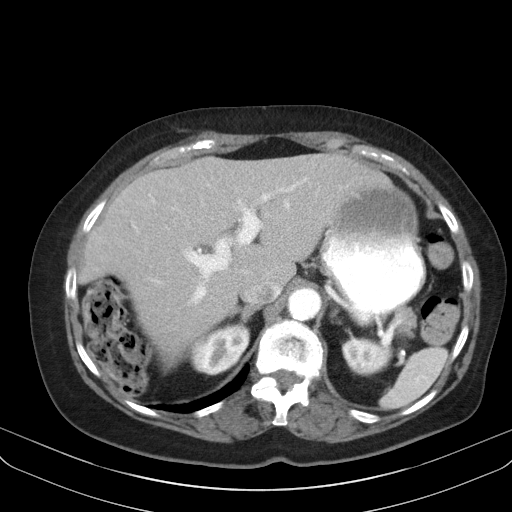
[im 66/83  lung]
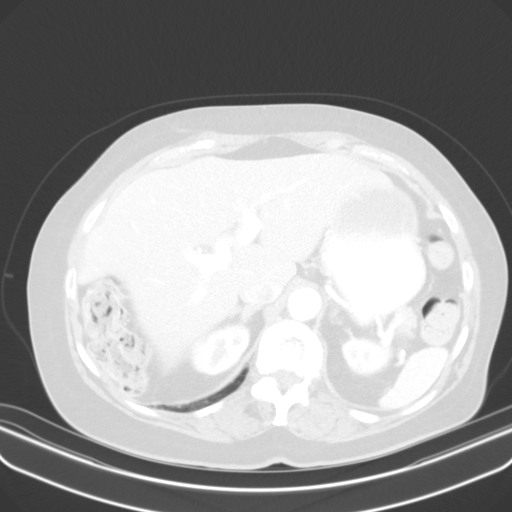
[im 72/83  soft-tissue]
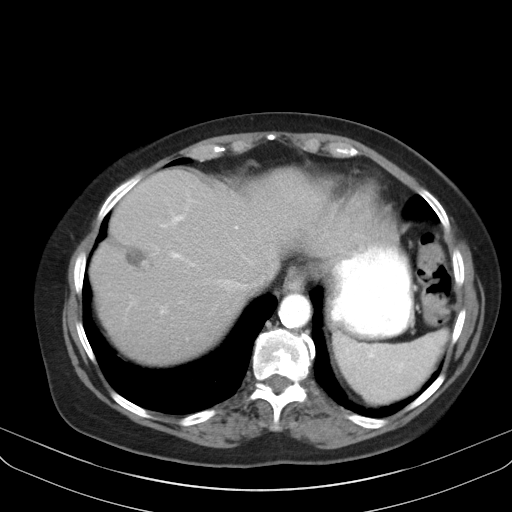
[im 72/83  lung]
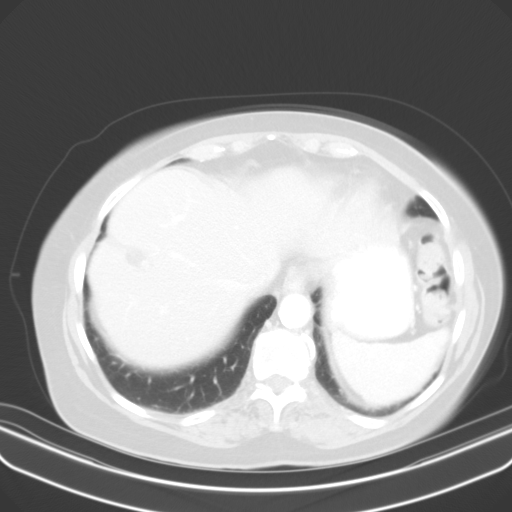
[im 77/83  soft-tissue]
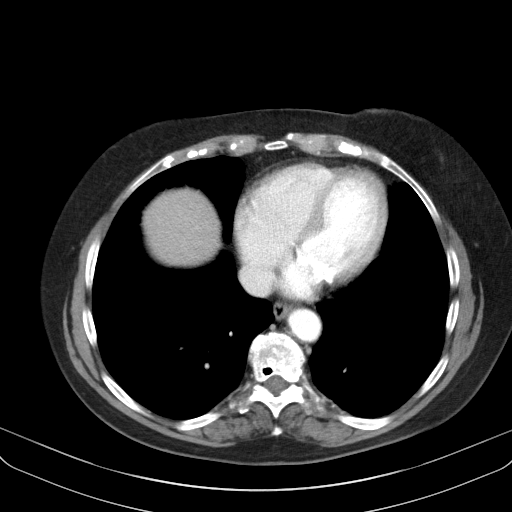
[im 77/83  lung]
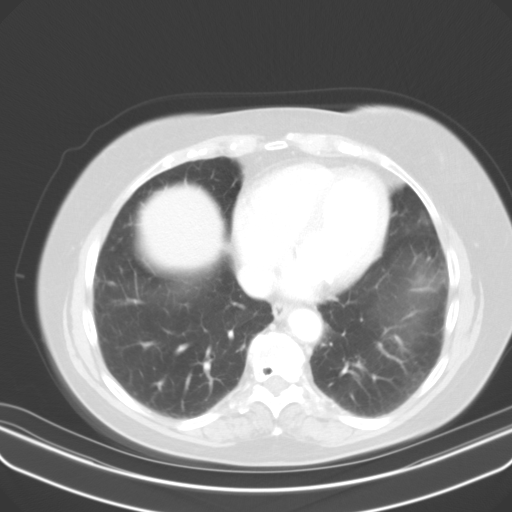

[13 of 32 positions shown; findings below may reference images not displayed]

FINDINGS: Lower chest: Lung bases are clear.

Hepatobiliary: cystic lesion in RIGHT hepatic lobe measuring 2.4 cm
with a small nodular enhancement along the margin. This cyst is
progressively decreased in size over multiple comparison exams
consistent benign finding. No acute findings in the liver.
Gallbladder normal. No biliary duct dilatation.

Pancreas: Pancreas is normal. No ductal dilatation. No pancreatic
inflammation.

Spleen: Normal spleen

Adrenals/urinary tract: Adrenal glands normal. Several small
exophytic low-density lesions LEFT kidney has simple fluid
attenuation. Similar findings posterior in the RIGHT kidney. Ureters
and bladder normal.

Stomach/Bowel: Stomach, duodenum and small bowel normal. No small
bowel dilatation or inflammation. Terminal ileum is normal. Appendix
not identified. Ascending and transverse colon normal. Several
diverticula descending colon without acute inflammation. Multiple
diverticula sigmoid colon without acute inflammation. There is
moderate volume stool in the ascending colon. Otherwise normal stool
volume. Minimal stool in the rectum

Vascular/Lymphatic: Abdominal aorta is normal caliber with
atherosclerotic calcification. There is no retroperitoneal or
periportal lymphadenopathy. No pelvic lymphadenopathy.

Reproductive: Post hysterectomy.  Adnexa unremarkable

Other: No free fluid

Musculoskeletal: Degenerative change the pubic symphysis. Sclerosis
along the SI joints. No acute osseous findings.
IMPRESSION: 1. No acute findings in the abdomen pelvis.
2. No evidence of bowel obstruction. Moderate volume stool in the
ascending colon.
3. LEFT colon diverticulosis without evidence diverticulitis.
4. Benign-appearing cysts in the liver and kidneys.
5. Degenerative bony change in the spine and pelvis.

## 2021-02-25 DIAGNOSIS — H353221 Exudative age-related macular degeneration, left eye, with active choroidal neovascularization: Secondary | ICD-10-CM | POA: Diagnosis not present

## 2021-02-25 DIAGNOSIS — H353111 Nonexudative age-related macular degeneration, right eye, early dry stage: Secondary | ICD-10-CM | POA: Diagnosis not present

## 2021-02-25 DIAGNOSIS — H43813 Vitreous degeneration, bilateral: Secondary | ICD-10-CM | POA: Diagnosis not present

## 2021-02-25 DIAGNOSIS — H31012 Macula scars of posterior pole (postinflammatory) (post-traumatic), left eye: Secondary | ICD-10-CM | POA: Diagnosis not present

## 2021-02-27 DIAGNOSIS — M545 Low back pain, unspecified: Secondary | ICD-10-CM | POA: Diagnosis not present

## 2021-03-17 DIAGNOSIS — E7800 Pure hypercholesterolemia, unspecified: Secondary | ICD-10-CM | POA: Diagnosis not present

## 2021-03-17 DIAGNOSIS — D509 Iron deficiency anemia, unspecified: Secondary | ICD-10-CM | POA: Diagnosis not present

## 2021-03-17 DIAGNOSIS — I10 Essential (primary) hypertension: Secondary | ICD-10-CM | POA: Diagnosis not present

## 2021-03-17 DIAGNOSIS — E039 Hypothyroidism, unspecified: Secondary | ICD-10-CM | POA: Diagnosis not present

## 2021-03-27 DIAGNOSIS — H401131 Primary open-angle glaucoma, bilateral, mild stage: Secondary | ICD-10-CM | POA: Diagnosis not present

## 2021-03-27 DIAGNOSIS — Z961 Presence of intraocular lens: Secondary | ICD-10-CM | POA: Diagnosis not present

## 2021-03-27 DIAGNOSIS — H524 Presbyopia: Secondary | ICD-10-CM | POA: Diagnosis not present

## 2021-04-02 DIAGNOSIS — R0789 Other chest pain: Secondary | ICD-10-CM | POA: Diagnosis not present

## 2021-04-02 DIAGNOSIS — E039 Hypothyroidism, unspecified: Secondary | ICD-10-CM | POA: Diagnosis not present

## 2021-04-02 DIAGNOSIS — M150 Primary generalized (osteo)arthritis: Secondary | ICD-10-CM | POA: Diagnosis not present

## 2021-04-02 DIAGNOSIS — I10 Essential (primary) hypertension: Secondary | ICD-10-CM | POA: Diagnosis not present

## 2021-04-02 DIAGNOSIS — Z0000 Encounter for general adult medical examination without abnormal findings: Secondary | ICD-10-CM | POA: Diagnosis not present

## 2021-04-02 DIAGNOSIS — E7800 Pure hypercholesterolemia, unspecified: Secondary | ICD-10-CM | POA: Diagnosis not present

## 2021-04-24 ENCOUNTER — Other Ambulatory Visit: Payer: Self-pay

## 2021-04-24 DIAGNOSIS — M1991 Primary osteoarthritis, unspecified site: Secondary | ICD-10-CM

## 2021-04-24 DIAGNOSIS — T8859XA Other complications of anesthesia, initial encounter: Secondary | ICD-10-CM | POA: Insufficient documentation

## 2021-04-24 DIAGNOSIS — G8929 Other chronic pain: Secondary | ICD-10-CM

## 2021-04-24 DIAGNOSIS — Z8709 Personal history of other diseases of the respiratory system: Secondary | ICD-10-CM | POA: Insufficient documentation

## 2021-04-24 DIAGNOSIS — E7849 Other hyperlipidemia: Secondary | ICD-10-CM | POA: Insufficient documentation

## 2021-04-24 DIAGNOSIS — K5900 Constipation, unspecified: Secondary | ICD-10-CM | POA: Insufficient documentation

## 2021-04-24 DIAGNOSIS — L719 Rosacea, unspecified: Secondary | ICD-10-CM | POA: Insufficient documentation

## 2021-04-24 DIAGNOSIS — Z7409 Other reduced mobility: Secondary | ICD-10-CM

## 2021-04-24 DIAGNOSIS — N1831 Chronic kidney disease, stage 3a: Secondary | ICD-10-CM | POA: Insufficient documentation

## 2021-04-24 DIAGNOSIS — R200 Anesthesia of skin: Secondary | ICD-10-CM | POA: Insufficient documentation

## 2021-04-24 DIAGNOSIS — D649 Anemia, unspecified: Secondary | ICD-10-CM | POA: Insufficient documentation

## 2021-04-24 DIAGNOSIS — F5101 Primary insomnia: Secondary | ICD-10-CM | POA: Insufficient documentation

## 2021-04-24 DIAGNOSIS — R0789 Other chest pain: Secondary | ICD-10-CM | POA: Insufficient documentation

## 2021-04-24 DIAGNOSIS — M25673 Stiffness of unspecified ankle, not elsewhere classified: Secondary | ICD-10-CM

## 2021-04-24 DIAGNOSIS — G479 Sleep disorder, unspecified: Secondary | ICD-10-CM | POA: Insufficient documentation

## 2021-04-24 DIAGNOSIS — T7840XA Allergy, unspecified, initial encounter: Secondary | ICD-10-CM | POA: Insufficient documentation

## 2021-04-24 DIAGNOSIS — K219 Gastro-esophageal reflux disease without esophagitis: Secondary | ICD-10-CM

## 2021-04-24 DIAGNOSIS — Z87442 Personal history of urinary calculi: Secondary | ICD-10-CM | POA: Insufficient documentation

## 2021-04-24 DIAGNOSIS — K7689 Other specified diseases of liver: Secondary | ICD-10-CM | POA: Insufficient documentation

## 2021-04-24 DIAGNOSIS — K625 Hemorrhage of anus and rectum: Secondary | ICD-10-CM | POA: Insufficient documentation

## 2021-04-24 DIAGNOSIS — H409 Unspecified glaucoma: Secondary | ICD-10-CM | POA: Insufficient documentation

## 2021-04-24 DIAGNOSIS — E785 Hyperlipidemia, unspecified: Secondary | ICD-10-CM

## 2021-04-24 DIAGNOSIS — E0590 Thyrotoxicosis, unspecified without thyrotoxic crisis or storm: Secondary | ICD-10-CM | POA: Insufficient documentation

## 2021-04-24 DIAGNOSIS — M4800 Spinal stenosis, site unspecified: Secondary | ICD-10-CM | POA: Insufficient documentation

## 2021-04-24 DIAGNOSIS — M1990 Unspecified osteoarthritis, unspecified site: Secondary | ICD-10-CM | POA: Insufficient documentation

## 2021-04-24 DIAGNOSIS — M25572 Pain in left ankle and joints of left foot: Secondary | ICD-10-CM | POA: Insufficient documentation

## 2021-04-24 DIAGNOSIS — M545 Low back pain, unspecified: Secondary | ICD-10-CM | POA: Insufficient documentation

## 2021-04-24 DIAGNOSIS — H3530 Unspecified macular degeneration: Secondary | ICD-10-CM | POA: Insufficient documentation

## 2021-04-24 DIAGNOSIS — M659 Synovitis and tenosynovitis, unspecified: Secondary | ICD-10-CM | POA: Insufficient documentation

## 2021-04-24 DIAGNOSIS — D509 Iron deficiency anemia, unspecified: Secondary | ICD-10-CM | POA: Insufficient documentation

## 2021-04-24 DIAGNOSIS — I10 Essential (primary) hypertension: Secondary | ICD-10-CM | POA: Insufficient documentation

## 2021-04-24 DIAGNOSIS — K2100 Gastro-esophageal reflux disease with esophagitis, without bleeding: Secondary | ICD-10-CM | POA: Insufficient documentation

## 2021-04-24 DIAGNOSIS — Z8701 Personal history of pneumonia (recurrent): Secondary | ICD-10-CM | POA: Insufficient documentation

## 2021-04-24 DIAGNOSIS — R269 Unspecified abnormalities of gait and mobility: Secondary | ICD-10-CM

## 2021-04-24 DIAGNOSIS — M65972 Unspecified synovitis and tenosynovitis, left ankle and foot: Secondary | ICD-10-CM

## 2021-04-24 DIAGNOSIS — N289 Disorder of kidney and ureter, unspecified: Secondary | ICD-10-CM | POA: Insufficient documentation

## 2021-04-24 DIAGNOSIS — E7800 Pure hypercholesterolemia, unspecified: Secondary | ICD-10-CM | POA: Insufficient documentation

## 2021-04-24 HISTORY — DX: Other reduced mobility: Z74.09

## 2021-04-24 HISTORY — DX: Gastro-esophageal reflux disease without esophagitis: K21.9

## 2021-04-24 HISTORY — DX: Primary osteoarthritis, unspecified site: M19.91

## 2021-04-24 HISTORY — DX: Unspecified synovitis and tenosynovitis, left ankle and foot: M65.972

## 2021-04-24 HISTORY — DX: Hyperlipidemia, unspecified: E78.5

## 2021-04-24 HISTORY — DX: Stiffness of unspecified ankle, not elsewhere classified: M25.673

## 2021-04-24 HISTORY — DX: Synovitis and tenosynovitis, unspecified: M65.9

## 2021-04-24 HISTORY — DX: Other chronic pain: G89.29

## 2021-04-24 HISTORY — DX: Unspecified abnormalities of gait and mobility: R26.9

## 2021-04-24 HISTORY — DX: Pain in left ankle and joints of left foot: M25.572

## 2021-05-01 ENCOUNTER — Ambulatory Visit (INDEPENDENT_AMBULATORY_CARE_PROVIDER_SITE_OTHER): Payer: Medicare Other | Admitting: Cardiology

## 2021-05-01 ENCOUNTER — Other Ambulatory Visit: Payer: Self-pay

## 2021-05-01 ENCOUNTER — Encounter: Payer: Self-pay | Admitting: Cardiology

## 2021-05-01 VITALS — BP 162/78 | HR 68 | Ht 61.0 in | Wt 145.1 lb

## 2021-05-01 DIAGNOSIS — I10 Essential (primary) hypertension: Secondary | ICD-10-CM

## 2021-05-01 DIAGNOSIS — M19019 Primary osteoarthritis, unspecified shoulder: Secondary | ICD-10-CM | POA: Insufficient documentation

## 2021-05-01 DIAGNOSIS — R072 Precordial pain: Secondary | ICD-10-CM

## 2021-05-01 DIAGNOSIS — E7800 Pure hypercholesterolemia, unspecified: Secondary | ICD-10-CM | POA: Diagnosis not present

## 2021-05-01 DIAGNOSIS — R1030 Lower abdominal pain, unspecified: Secondary | ICD-10-CM

## 2021-05-01 DIAGNOSIS — I700 Atherosclerosis of aorta: Secondary | ICD-10-CM | POA: Insufficient documentation

## 2021-05-01 DIAGNOSIS — R079 Chest pain, unspecified: Secondary | ICD-10-CM

## 2021-05-01 HISTORY — DX: Chest pain, unspecified: R07.9

## 2021-05-01 HISTORY — DX: Atherosclerosis of aorta: I70.0

## 2021-05-01 HISTORY — DX: Primary osteoarthritis, unspecified shoulder: M19.019

## 2021-05-01 HISTORY — DX: Lower abdominal pain, unspecified: R10.30

## 2021-05-01 NOTE — Patient Instructions (Signed)
Medication Instructions:  Your physician recommends that you continue on your current medications as directed. Please refer to the Current Medication list given to you today.  *If you need a refill on your cardiac medications before your next appointment, please call your pharmacy*   Lab Work: None ordered If you have labs (blood work) drawn today and your tests are completely normal, you will receive your results only by: MyChart Message (if you have MyChart) OR A paper copy in the mail If you have any lab test that is abnormal or we need to change your treatment, we will call you to review the results.   Testing/Procedures: Your physician has requested that you have a lexiscan myoview. For further information please visit [email or web address]. Please follow instruction sheet, as given.  The test will take approximately 3 to 4 hours to complete; you may bring reading material.  If someone comes with you to your appointment, they will need to remain in the main lobby due to limited space in the testing area.   How to prepare for your Myocardial Perfusion Test: Do not eat or drink 3 hours prior to your test, except you may have water. Do not consume products containing caffeine (regular or decaffeinated) 12 hours prior to your test. (ex: coffee, chocolate, sodas, tea). Do bring a list of your current medications with you.  If not listed below, you may take your medications as normal. Do wear comfortable clothes (no dresses or overalls) and walking shoes, tennis shoes preferred (No heels or open toe shoes are allowed). Do NOT wear cologne, perfume, aftershave, or lotions (deodorant is allowed). If these instructions are not followed, your test will have to be rescheduled.  Your physician has requested that you have an echocardiogram. Echocardiography is a painless test that uses sound waves to create images of your heart. It provides your doctor with information about the size and shape of  your heart and how well your heart's chambers and valves are working. This procedure takes approximately one hour. There are no restrictions for this procedure.   Follow-Up: At [facility], you and your health needs are our priority.  As part of our continuing mission to provide you with exceptional heart care, we have created designated Provider Care Teams.  These Care Teams include your primary Cardiologist (physician) and Advanced Practice Providers (APPs -  Physician Assistants and Nurse Practitioners) who all work together to provide you with the care you need, when you need it.  We recommend signing up for the patient portal called "MyChart".  Sign up information is provided on this After Visit Summary.  MyChart is used to connect with patients for Virtual Visits (Telemedicine).  Patients are able to view lab/test results, encounter notes, upcoming appointments, etc.  Non-urgent messages can be sent to your provider as well.   To learn more about what you can do with MyChart, go to [email or web address].    Your next appointment:   6 month(s)  The format for your next appointment:   In Person  Provider:   Rajan Revankar, MD   Other Instructions Cardiac Nuclear Scan A cardiac nuclear scan is a test that is done to check the flow of blood to your heart. It is done when you are resting and when you are exercising. The test looks for problems such as: Not enough blood reaching a portion of the heart. The heart muscle not working as it should. You may need this test if: You have heart disease. You have   had lab results that are not normal. You have had heart surgery or a balloon procedure to open up blocked arteries (angioplasty). You have chest pain. You have shortness of breath. In this test, a special dye (tracer) is put into your bloodstream. The tracer will travel to your heart. A camera will then take pictures of your heart to see how the tracer moves through your heart. This  test is usually done at a hospital and takes 2-4 hours. Tell a doctor about: Any allergies you have. All medicines you are taking, including vitamins, herbs, eye drops, creams, and over-the-counter medicines. Any problems you or family members have had with anesthetic medicines. Any blood disorders you have. Any surgeries you have had. Any medical conditions you have. Whether you are pregnant or may be pregnant. What are the risks? Generally, this is a safe test. However, problems may occur, such as: Serious chest pain and heart attack. This is only a risk if the stress portion of the test is done. Rapid heartbeat. A feeling of warmth in your chest. This feeling usually does not last long. Allergic reaction to the tracer. What happens before the test? Ask your doctor about changing or stopping your normal medicines. This is important. Follow instructions from your doctor about what you cannot eat or drink. Remove your jewelry on the day of the test. What happens during the test? An IV tube will be inserted into one of your veins. Your doctor will give you a small amount of tracer through the IV tube. You will wait for 20-40 minutes while the tracer moves through your bloodstream. Your heart will be monitored with an electrocardiogram (ECG). You will lie down on an exam table. Pictures of your heart will be taken for about 15-20 minutes. You may also have a stress test. For this test, one of these things may be done: You will be asked to exercise on a treadmill or a stationary bike. You will be given medicines that will make your heart work harder. This is done if you are unable to exercise. When blood flow to your heart has peaked, a tracer will again be given through the IV tube. After 20-40 minutes, you will get back on the exam table. More pictures will be taken of your heart. Depending on the tracer that is used, more pictures may need to be taken 3-4 hours later. Your IV tube  will be removed when the test is over. The test may vary among doctors and hospitals. What happens after the test? Ask your doctor: Whether you can return to your normal schedule, including diet, activities, and medicines. Whether you should drink more fluids. This will help to remove the tracer from your body. Drink enough fluid to keep your pee (urine) pale yellow. Ask your doctor, or the department that is doing the test: When will my results be ready? How will I get my results? Summary A cardiac nuclear scan is a test that is done to check the flow of blood to your heart. Tell your doctor whether you are pregnant or may be pregnant. Before the test, ask your doctor about changing or stopping your normal medicines. This is important. Ask your doctor whether you can return to your normal activities. You may be asked to drink more fluids. This information is not intended to replace advice given to you by your health care provider. Make sure you discuss any questions you have with your health care provider. Document Revised: 07/26/2018 Document Reviewed: 09/19/2017   Elsevier Patient Education  2021 Elsevier Inc[employer]    Echocardiogram An echocardiogram is a test that uses sound waves (ultrasound) to produce images of the heart. Images from an echocardiogram can provide important information about: Heart size and shape. The size and thickness and movement of your heart's walls. Heart muscle function and strength. Heart valve function or if you have stenosis. Stenosis is when the heart valves are too narrow. If blood is flowing backward through the heart valves (regurgitation). A tumor or infectious growth around the heart valves. Areas of heart muscle that are not working well because of poor blood flow or injury from a heart attack. Aneurysm detection. An aneurysm is a weak or damaged part of an artery wall. The wall bulges out from the normal force of blood pumping through the body. Tell a  health care provider about: Any allergies you have. All medicines you are taking, including vitamins, herbs, eye drops, creams, and over-the-counter medicines. Any blood disorders you have. Any surgeries you have had. Any medical conditions you have. Whether you are pregnant or may be pregnant. What are the risks? Generally, this is a safe test. However, problems may occur, including an allergic reaction to dye (contrast) that may be used during the test. What happens before the test? No specific preparation is needed. You may eat and drink normally. What happens during the test? You will take off your clothes from the waist up and put on a hospital gown. Electrodes or electrocardiogram (ECG)patches may be placed on your chest. The electrodes or patches are then connected to a device that monitors your heart rate and rhythm. You will lie down on a table for an ultrasound exam. A gel will be applied to your chest to help sound waves pass through your skin. A handheld device, called a transducer, will be pressed against your chest and moved over your heart. The transducer produces sound waves that travel to your heart and bounce back (or "echo" back) to the transducer. These sound waves will be captured in real-time and changed into images of your heart that can be viewed on a video monitor. The images will be recorded on a computer and reviewed by your health care provider. You may be asked to change positions or hold your breath for a short time. This makes it easier to get different views or better views of your heart. In some cases, you may receive contrast through an IV in one of your veins. This can improve the quality of the pictures from your heart. The procedure may vary among health care providers and hospitals.    What can I expect after the test? You may return to your normal, everyday life, including diet, activities, and medicines, unless your health care provider tells you not to do  that. Follow these instructions at home: It is up to you to get the results of your test. Ask your health care provider, or the department that is doing the test, when your results will be ready. Keep all follow-up visits. This is important. Summary An echocardiogram is a test that uses sound waves (ultrasound) to produce images of the heart. Images from an echocardiogram can provide important information about the size and shape of your heart, heart muscle function, heart valve function, and other possible heart problems. You do not need to do anything to prepare before this test. You may eat and drink normally. After the echocardiogram is completed, you may return to your normal, everyday life, unless your   health care provider tells you not to do that. This information is not intended to replace advice given to you by your health care provider. Make sure you discuss any questions you have with your health care provider. Document Revised: 11/27/2019 Document Reviewed: 11/27/2019 Elsevier Patient Education  2021 Elsevier Inc.  

## 2021-05-01 NOTE — Progress Notes (Signed)
Cardiology Office Note:    Date:  05/01/2021   ID:  Patricia [name], DOB 03/20/1935, MRN [id number]  PCP:  Shirline Frees, MD  Cardiologist:  Jenean Lindau, MD   Referring MD: Shirline Frees, MD    ASSESSMENT:    1. Essential (primary) hypertension   2. Pure hypercholesterolemia   3. Aortic atherosclerosis (HCC)   4. Chest pain of uncertain etiology    PLAN:    In order of problems listed above:  Aortic atherosclerosis: Secondary prevention stressed to the patient.  Importance of compliance with diet medication stressed and she vocalized understanding. Chest pain: Atypical etiology but in view of risk factors we will do a Lexiscan sestamibi and she is agreeable. Cardiac murmur: Echocardiogram will be done to assess murmur heard on auscultation. Essential hypertension: Blood pressure stable.  She has an element of whitecoat hypertension.  Her blood pressure readings that she brought from home are excellent. Mixed dyslipidemia: Markedly elevated lipids based on blood work she brought from her home.  I will discuss statin therapy but she is not experiencing tolerance and wants to do diet.  Risks explained and she is vehemently against lipid-lowering medications I respect her wishes. Patient will be seen in follow-up appointment in 6 months or earlier if the patient has any concerns.  She knows to go to the nearest emergency room for any concerning symptoms.   Medication Adjustments/Labs and Tests Ordered: Current medicines are reviewed at length with the patient today.  Concerns regarding medicines are outlined above.  No orders of the defined types were placed in this encounter.  No orders of the defined types were placed in this encounter.    History of Present Illness:    Patricia [name] is a 87 y.o. female who is being seen today for the evaluation of chest pain at the request of Shirline Frees, MD. patient is a pleasant 87 year old female.  She has past medical history of  essential hypertension, dyslipidemia and aortic atherosclerosis.  She mentions to me that she occasionally has chest discomfort but this is not related to exertion.  No orthopnea or PND.  No radiation to any part of the body.  Overall she is an active lady.  Her chest pain i appears atypical for coronary etiology.  At the time of my evaluation, the patient is alert awake oriented and in no distress.  Past Medical History:  Diagnosis Date   Abnormal gait 04/24/2021   Age-related macular degeneration, dry, both eyes 03/08/2016   Allergies    Anemia    Arthritis    Bilateral presbyopia 03/05/2015   Chronic pain of left ankle 4/0/9811   Complication of anesthesia    nausea from fentayl; hypotension episode after previous shoulder surgery   Constipation    Decreased range of motion of ankle 04/24/2021   Degenerative arthritis of right shoulder region 03/06/2011   DJD (degenerative joint disease)    Essential (primary) hypertension    GERD (gastroesophageal reflux disease) 04/24/2021   GERD with esophagitis    Glaucoma    History of bronchitis    History of kidney stones    History of pneumonia    Hypertension    Hyperthyroidism    Impaired functional mobility, balance, and endurance 04/24/2021   Iron deficiency anemia    Left rotator cuff tear arthropathy 12/09/2015   Liver nodule    Low back pain    Macular degeneration disease    Nonexudative age-related macular degeneration 03/05/2015   Normal tension glaucoma  of both eyes, mild stage 03/05/2015   Numbness    left foot and toes   Osteoarthritis    Other chest pain    Other hyperlipidemia    Postoperative anemia due to acute blood loss 03/10/2011   Had pre-existing anemia, now symptomatic with acute on chronic anemia.   Primary insomnia    Pseudophakia of both eyes 03/05/2015   Pure hypercholesterolemia    Rectal bleeding    Renal insufficiency    Rosacea    Rotator cuff tear, right 03/06/2011   S/P shoulder replacement 12/09/2015    Sleep disorder, unspecified    Spinal stenosis    Spinal stenosis    Spinal stenosis    Stage 3a chronic kidney disease (HCC)    Synovitis of left ankle 04/24/2021    Past Surgical History:  Procedure Laterality Date   ABDOMINAL HYSTERECTOMY     CARPAL TUNNEL RELEASE     DILATION AND CURETTAGE OF UTERUS     EYE SURGERY Bilateral    cataract   JOINT REPLACEMENT     lt knee-05   KNEE SURGERY     REVERSE SHOULDER ARTHROPLASTY  03/09/2011   Procedure: REVERSE SHOULDER ARTHROPLASTY;  Surgeon: Johnny Bridge;  Location: [facility];  Service: Orthopedics;  Laterality: Right;   REVERSE SHOULDER ARTHROPLASTY Left 12/09/2015   Procedure: REVERSE TOTAL SHOULDER ARTHROPLASTY;  Surgeon: Marchia Bond, MD;  Location: [facility];  Service: Orthopedics;  Laterality: Left;   ROTATOR CUFF REPAIR, left      Current Medications: Current Meds  Medication Sig   ascorbic acid (VITAMIN C) 500 MG tablet Take 1,500 mg by mouth daily.   atenolol (TENORMIN) 25 MG tablet Take 25 mg by mouth daily.     cholecalciferol (VITAMIN D3) 25 MCG (1000 UNIT) tablet Take 2,000 Units by mouth daily.   DANDELION ROOT PO Take 1 capsule by mouth daily.   DOCUSATE SODIUM PO Take 1-2 capsules by mouth as needed (constipation).   Glucosamine HCl 1000 MG TABS Take 1,000 mg by mouth in the morning and at bedtime.   irbesartan (AVAPRO) 150 MG tablet Take 150 mg by mouth daily.   Magnesium 250 MG TABS Take 250 mg by mouth daily.   Multiple Vitamins-Minerals (MULTIVITAMINS THER. W/MINERALS) TABS Take 1 tablet by mouth daily.     Multiple Vitamins-Minerals (PRESERVISION AREDS) CAPS Take 1 capsule by mouth 2 (two) times daily.     Omega-3 Fatty Acids (FISH OIL) 1200 MG CAPS Take 1,200 mg by mouth daily.   Probiotic Product (PROBIOTIC PO) Take 1 capsule by mouth daily.   Spirulina 500 MG TABS Take 500 mg by mouth daily.   zinc gluconate 50 MG tablet Take 50 mg by mouth daily.     Allergies:   Dm-guaifenesin er,  Doxycycline, Codeine, Fentanyl and related, Meclizine, Midazolam, Sulfa antibiotics, and Tramadol   Social History   Socioeconomic History   Marital status: Widowed    Spouse name: Not on file   Number of children: Not on file   Years of education: Not on file   Highest education level: Not on file  Occupational History   Not on file  Tobacco Use   Smoking status: Never   Smokeless tobacco: Never  Substance and Sexual Activity   Alcohol use: No   Drug use: No   Sexual activity: Not on file  Other Topics Concern   Not on file  Social History Narrative   Not on file   Social Determinants  of Health   Financial Resource Strain: Not on file  Food Insecurity: Not on file  Transportation Needs: Not on file  Physical Activity: Not on file  Stress: Not on file  Social Connections: Not on file     Family History: The patient's Family history is unknown by patient.  ROS:   Please see the history of present illness.    All other systems reviewed and are negative.  EKGs/Labs/Other Studies Reviewed:    The following studies were reviewed today: EKG reveals sinus rhythm nonspecific ST-T changes   Recent Labs: No results found for requested labs within last 8760 hours.  Recent Lipid Panel No results found for: CHOL, TRIG, HDL, CHOLHDL, VLDL, LDLCALC, LDLDIRECT  Physical Exam:    VS:  BP (!) 162/78    Pulse 68    Ht 5\' 1"  (1.549 m)    Wt 145 lb 1.3 oz (65.8 kg)    SpO2 95%    BMI 27.41 kg/m     Wt Readings from Last 3 Encounters:  05/01/21 145 lb 1.3 oz (65.8 kg)  12/14/15 144 lb (65.3 kg)  12/09/15 144 lb 8 oz (65.5 kg)     GEN: Patient is in no acute distress HEENT: Normal NECK: No JVD; No carotid bruits LYMPHATICS: No lymphadenopathy CARDIAC: S1 S2 regular, 2/6 systolic murmur at the apex. RESPIRATORY:  Clear to auscultation without rales, wheezing or rhonchi  ABDOMEN: Soft, non-tender, non-distended MUSCULOSKELETAL:  No edema; No deformity  SKIN: Warm and  dry NEUROLOGIC:  Alert and oriented x 3 PSYCHIATRIC:  Normal affect    Signed, Jenean Lindau, MD  05/01/2021 2:07 PM    [place]

## 2021-05-06 ENCOUNTER — Telehealth (HOSPITAL_COMMUNITY): Payer: Self-pay | Admitting: *Deleted

## 2021-05-06 NOTE — Telephone Encounter (Signed)
Left message on voicemail per DPR in reference to upcoming appointment scheduled on 05/11/2021 at 10:45 with detailed instructions given per Myocardial Perfusion Study Information Sheet for the test. LM to arrive 15 minutes early, and that it is imperative to arrive on time for appointment to keep from having the test rescheduled. If you need to cancel or reschedule your appointment, please call the office within 24 hours of your appointment. Failure to do so may result in a cancellation of your appointment, and a $50 no show fee. Phone number given for call back for any questions.

## 2021-05-11 ENCOUNTER — Ambulatory Visit (HOSPITAL_COMMUNITY): Payer: 59

## 2021-05-14 DIAGNOSIS — H10412 Chronic giant papillary conjunctivitis, left eye: Secondary | ICD-10-CM | POA: Diagnosis not present

## 2021-05-19 ENCOUNTER — Other Ambulatory Visit: Payer: Self-pay

## 2021-05-19 ENCOUNTER — Ambulatory Visit (HOSPITAL_COMMUNITY): Payer: Medicare Other | Attending: Cardiovascular Disease

## 2021-05-19 VITALS — Ht 61.0 in | Wt 145.0 lb

## 2021-05-19 DIAGNOSIS — I2584 Coronary atherosclerosis due to calcified coronary lesion: Secondary | ICD-10-CM | POA: Insufficient documentation

## 2021-05-19 DIAGNOSIS — R110 Nausea: Secondary | ICD-10-CM | POA: Insufficient documentation

## 2021-05-19 DIAGNOSIS — R072 Precordial pain: Secondary | ICD-10-CM | POA: Diagnosis not present

## 2021-05-19 DIAGNOSIS — R079 Chest pain, unspecified: Secondary | ICD-10-CM | POA: Diagnosis not present

## 2021-05-19 LAB — MYOCARDIAL PERFUSION IMAGING
LV dias vol: 54 mL (ref 46–106)
LV sys vol: 12 mL
Nuc Stress EF: 78 %
Peak HR: 97 {beats}/min
Rest HR: 73 {beats}/min
Rest Nuclear Isotope Dose: 10.2 mCi
SDS: 0
SRS: 0
SSS: 0
ST Depression (mm): 0 mm
Stress Nuclear Isotope Dose: 31.3 mCi
TID: 1.09

## 2021-05-19 MED ORDER — TECHNETIUM TC 99M TETROFOSMIN IV KIT
31.3000 | PACK | Freq: Once | INTRAVENOUS | Status: AC | PRN
  Administered 2021-05-19: 31.3 via INTRAVENOUS
  Filled 2021-05-19: qty 32

## 2021-05-19 MED ORDER — TECHNETIUM TC 99M TETROFOSMIN IV KIT
10.2000 | PACK | Freq: Once | INTRAVENOUS | Status: AC | PRN
  Administered 2021-05-19: 10.2 via INTRAVENOUS
  Filled 2021-05-19: qty 11

## 2021-05-19 MED ORDER — REGADENOSON 0.4 MG/5ML IV SOLN
0.4000 mg | Freq: Once | INTRAVENOUS | Status: AC
  Administered 2021-05-19: 0.4 mg via INTRAVENOUS

## 2021-05-19 MED ORDER — AMINOPHYLLINE 25 MG/ML IV SOLN
75.0000 mg | Freq: Once | INTRAVENOUS | Status: AC
  Administered 2021-05-19: 75 mg via INTRAVENOUS

## 2021-05-21 ENCOUNTER — Other Ambulatory Visit: Payer: Self-pay

## 2021-05-21 ENCOUNTER — Ambulatory Visit (HOSPITAL_BASED_OUTPATIENT_CLINIC_OR_DEPARTMENT_OTHER)
Admission: RE | Admit: 2021-05-21 | Discharge: 2021-05-21 | Disposition: A | Discharge status: Home / Self Care | Payer: Medicare Other | Source: Ambulatory Visit | Attending: Cardiology | Admitting: Cardiology

## 2021-05-21 DIAGNOSIS — R079 Chest pain, unspecified: Secondary | ICD-10-CM | POA: Diagnosis not present

## 2021-05-21 LAB — ECHOCARDIOGRAM COMPLETE
AR max vel: 2.06 cm2
AV Area VTI: 1.89 cm2
AV Area mean vel: 1.68 cm2
AV Mean grad: 8.5 mmHg
AV Peak grad: 11.7 mmHg
Ao pk vel: 1.71 m/s
Area-P 1/2: 2.65 cm2
P 1/2 time: 493 msec
S' Lateral: 2.3 cm

## 2021-05-21 NOTE — Progress Notes (Signed)
°  Echocardiogram 2D Echocardiogram has been performed.  Patricia [name] F 05/21/2021, 1:57 PM

## 2021-06-05 DIAGNOSIS — I10 Essential (primary) hypertension: Secondary | ICD-10-CM | POA: Diagnosis not present

## 2021-06-05 DIAGNOSIS — J3089 Other allergic rhinitis: Secondary | ICD-10-CM | POA: Diagnosis not present

## 2021-06-25 DIAGNOSIS — D2322 Other benign neoplasm of skin of left ear and external auricular canal: Secondary | ICD-10-CM | POA: Diagnosis not present

## 2021-06-25 DIAGNOSIS — X32XXXA Exposure to sunlight, initial encounter: Secondary | ICD-10-CM | POA: Diagnosis not present

## 2021-06-25 DIAGNOSIS — D2321 Other benign neoplasm of skin of right ear and external auricular canal: Secondary | ICD-10-CM | POA: Diagnosis not present

## 2021-06-25 DIAGNOSIS — L570 Actinic keratosis: Secondary | ICD-10-CM | POA: Diagnosis not present

## 2021-06-25 DIAGNOSIS — Z7189 Other specified counseling: Secondary | ICD-10-CM | POA: Diagnosis not present

## 2021-06-25 DIAGNOSIS — L814 Other melanin hyperpigmentation: Secondary | ICD-10-CM | POA: Diagnosis not present

## 2021-06-25 DIAGNOSIS — L821 Other seborrheic keratosis: Secondary | ICD-10-CM | POA: Diagnosis not present

## 2021-07-17 DIAGNOSIS — M25512 Pain in left shoulder: Secondary | ICD-10-CM | POA: Diagnosis not present

## 2021-07-17 DIAGNOSIS — M25511 Pain in right shoulder: Secondary | ICD-10-CM | POA: Diagnosis not present

## 2021-07-22 DIAGNOSIS — M25551 Pain in right hip: Secondary | ICD-10-CM | POA: Diagnosis not present

## 2021-07-22 DIAGNOSIS — M4800 Spinal stenosis, site unspecified: Secondary | ICD-10-CM | POA: Diagnosis not present

## 2021-07-22 DIAGNOSIS — M65872 Other synovitis and tenosynovitis, left ankle and foot: Secondary | ICD-10-CM | POA: Diagnosis not present

## 2021-07-22 DIAGNOSIS — M25572 Pain in left ankle and joints of left foot: Secondary | ICD-10-CM | POA: Diagnosis not present

## 2021-07-22 DIAGNOSIS — Z7409 Other reduced mobility: Secondary | ICD-10-CM | POA: Diagnosis not present

## 2021-07-22 DIAGNOSIS — R293 Abnormal posture: Secondary | ICD-10-CM | POA: Diagnosis not present

## 2021-07-22 DIAGNOSIS — G8929 Other chronic pain: Secondary | ICD-10-CM | POA: Diagnosis not present

## 2021-07-22 DIAGNOSIS — R269 Unspecified abnormalities of gait and mobility: Secondary | ICD-10-CM | POA: Diagnosis not present

## 2021-07-22 DIAGNOSIS — R6889 Other general symptoms and signs: Secondary | ICD-10-CM | POA: Diagnosis not present

## 2021-07-28 DIAGNOSIS — R293 Abnormal posture: Secondary | ICD-10-CM | POA: Diagnosis not present

## 2021-07-28 DIAGNOSIS — R6889 Other general symptoms and signs: Secondary | ICD-10-CM | POA: Diagnosis not present

## 2021-07-28 DIAGNOSIS — M4800 Spinal stenosis, site unspecified: Secondary | ICD-10-CM | POA: Diagnosis not present

## 2021-07-28 DIAGNOSIS — Z7409 Other reduced mobility: Secondary | ICD-10-CM | POA: Diagnosis not present

## 2021-07-28 DIAGNOSIS — R269 Unspecified abnormalities of gait and mobility: Secondary | ICD-10-CM | POA: Diagnosis not present

## 2021-07-28 DIAGNOSIS — M25551 Pain in right hip: Secondary | ICD-10-CM | POA: Diagnosis not present

## 2021-07-30 DIAGNOSIS — R269 Unspecified abnormalities of gait and mobility: Secondary | ICD-10-CM | POA: Diagnosis not present

## 2021-07-30 DIAGNOSIS — R293 Abnormal posture: Secondary | ICD-10-CM | POA: Diagnosis not present

## 2021-07-30 DIAGNOSIS — M25551 Pain in right hip: Secondary | ICD-10-CM | POA: Diagnosis not present

## 2021-07-30 DIAGNOSIS — M4800 Spinal stenosis, site unspecified: Secondary | ICD-10-CM | POA: Diagnosis not present

## 2021-07-30 DIAGNOSIS — R6889 Other general symptoms and signs: Secondary | ICD-10-CM | POA: Diagnosis not present

## 2021-07-30 DIAGNOSIS — Z7409 Other reduced mobility: Secondary | ICD-10-CM | POA: Diagnosis not present

## 2021-08-04 DIAGNOSIS — R6889 Other general symptoms and signs: Secondary | ICD-10-CM | POA: Diagnosis not present

## 2021-08-04 DIAGNOSIS — R269 Unspecified abnormalities of gait and mobility: Secondary | ICD-10-CM | POA: Diagnosis not present

## 2021-08-04 DIAGNOSIS — R293 Abnormal posture: Secondary | ICD-10-CM | POA: Diagnosis not present

## 2021-08-04 DIAGNOSIS — Z7409 Other reduced mobility: Secondary | ICD-10-CM | POA: Diagnosis not present

## 2021-08-04 DIAGNOSIS — M4800 Spinal stenosis, site unspecified: Secondary | ICD-10-CM | POA: Diagnosis not present

## 2021-08-04 DIAGNOSIS — M25551 Pain in right hip: Secondary | ICD-10-CM | POA: Diagnosis not present

## 2021-08-06 DIAGNOSIS — R6889 Other general symptoms and signs: Secondary | ICD-10-CM | POA: Diagnosis not present

## 2021-08-06 DIAGNOSIS — M25551 Pain in right hip: Secondary | ICD-10-CM | POA: Diagnosis not present

## 2021-08-06 DIAGNOSIS — M4800 Spinal stenosis, site unspecified: Secondary | ICD-10-CM | POA: Diagnosis not present

## 2021-08-06 DIAGNOSIS — R269 Unspecified abnormalities of gait and mobility: Secondary | ICD-10-CM | POA: Diagnosis not present

## 2021-08-06 DIAGNOSIS — Z7409 Other reduced mobility: Secondary | ICD-10-CM | POA: Diagnosis not present

## 2021-08-06 DIAGNOSIS — R293 Abnormal posture: Secondary | ICD-10-CM | POA: Diagnosis not present

## 2021-08-10 DIAGNOSIS — Z7409 Other reduced mobility: Secondary | ICD-10-CM | POA: Diagnosis not present

## 2021-08-10 DIAGNOSIS — R293 Abnormal posture: Secondary | ICD-10-CM | POA: Diagnosis not present

## 2021-08-10 DIAGNOSIS — M4800 Spinal stenosis, site unspecified: Secondary | ICD-10-CM | POA: Diagnosis not present

## 2021-08-10 DIAGNOSIS — M25551 Pain in right hip: Secondary | ICD-10-CM | POA: Diagnosis not present

## 2021-08-10 DIAGNOSIS — R269 Unspecified abnormalities of gait and mobility: Secondary | ICD-10-CM | POA: Diagnosis not present

## 2021-08-10 DIAGNOSIS — R6889 Other general symptoms and signs: Secondary | ICD-10-CM | POA: Diagnosis not present

## 2021-08-13 DIAGNOSIS — M25551 Pain in right hip: Secondary | ICD-10-CM | POA: Diagnosis not present

## 2021-08-13 DIAGNOSIS — Z7409 Other reduced mobility: Secondary | ICD-10-CM | POA: Diagnosis not present

## 2021-08-13 DIAGNOSIS — R269 Unspecified abnormalities of gait and mobility: Secondary | ICD-10-CM | POA: Diagnosis not present

## 2021-08-13 DIAGNOSIS — M4800 Spinal stenosis, site unspecified: Secondary | ICD-10-CM | POA: Diagnosis not present

## 2021-08-13 DIAGNOSIS — R6889 Other general symptoms and signs: Secondary | ICD-10-CM | POA: Diagnosis not present

## 2021-08-13 DIAGNOSIS — R293 Abnormal posture: Secondary | ICD-10-CM | POA: Diagnosis not present

## 2021-08-17 DIAGNOSIS — M25551 Pain in right hip: Secondary | ICD-10-CM | POA: Diagnosis not present

## 2021-08-17 DIAGNOSIS — Z7409 Other reduced mobility: Secondary | ICD-10-CM | POA: Diagnosis not present

## 2021-08-17 DIAGNOSIS — M4800 Spinal stenosis, site unspecified: Secondary | ICD-10-CM | POA: Diagnosis not present

## 2021-08-17 DIAGNOSIS — R293 Abnormal posture: Secondary | ICD-10-CM | POA: Diagnosis not present

## 2021-08-17 DIAGNOSIS — R269 Unspecified abnormalities of gait and mobility: Secondary | ICD-10-CM | POA: Diagnosis not present

## 2021-08-17 DIAGNOSIS — Z789 Other specified health status: Secondary | ICD-10-CM | POA: Diagnosis not present

## 2021-08-20 DIAGNOSIS — M4800 Spinal stenosis, site unspecified: Secondary | ICD-10-CM | POA: Diagnosis not present

## 2021-08-20 DIAGNOSIS — Z7409 Other reduced mobility: Secondary | ICD-10-CM | POA: Diagnosis not present

## 2021-08-20 DIAGNOSIS — M25551 Pain in right hip: Secondary | ICD-10-CM | POA: Diagnosis not present

## 2021-08-20 DIAGNOSIS — R293 Abnormal posture: Secondary | ICD-10-CM | POA: Diagnosis not present

## 2021-08-20 DIAGNOSIS — Z789 Other specified health status: Secondary | ICD-10-CM | POA: Diagnosis not present

## 2021-08-20 DIAGNOSIS — R269 Unspecified abnormalities of gait and mobility: Secondary | ICD-10-CM | POA: Diagnosis not present

## 2021-09-11 DIAGNOSIS — H43811 Vitreous degeneration, right eye: Secondary | ICD-10-CM | POA: Diagnosis not present

## 2021-09-11 DIAGNOSIS — H353223 Exudative age-related macular degeneration, left eye, with inactive scar: Secondary | ICD-10-CM | POA: Diagnosis not present

## 2021-09-11 DIAGNOSIS — H26493 Other secondary cataract, bilateral: Secondary | ICD-10-CM | POA: Diagnosis not present

## 2021-09-11 DIAGNOSIS — H353111 Nonexudative age-related macular degeneration, right eye, early dry stage: Secondary | ICD-10-CM | POA: Diagnosis not present

## 2021-09-25 DIAGNOSIS — H353221 Exudative age-related macular degeneration, left eye, with active choroidal neovascularization: Secondary | ICD-10-CM | POA: Diagnosis not present

## 2021-09-25 DIAGNOSIS — H401131 Primary open-angle glaucoma, bilateral, mild stage: Secondary | ICD-10-CM | POA: Diagnosis not present

## 2021-11-26 DIAGNOSIS — M150 Primary generalized (osteo)arthritis: Secondary | ICD-10-CM | POA: Diagnosis not present

## 2021-11-26 DIAGNOSIS — H9190 Unspecified hearing loss, unspecified ear: Secondary | ICD-10-CM | POA: Diagnosis not present

## 2021-11-26 DIAGNOSIS — J3089 Other allergic rhinitis: Secondary | ICD-10-CM | POA: Diagnosis not present

## 2021-11-26 DIAGNOSIS — E039 Hypothyroidism, unspecified: Secondary | ICD-10-CM | POA: Diagnosis not present

## 2021-11-26 DIAGNOSIS — E7800 Pure hypercholesterolemia, unspecified: Secondary | ICD-10-CM | POA: Diagnosis not present

## 2021-11-26 DIAGNOSIS — I10 Essential (primary) hypertension: Secondary | ICD-10-CM | POA: Diagnosis not present

## 2021-11-26 DIAGNOSIS — R42 Dizziness and giddiness: Secondary | ICD-10-CM | POA: Diagnosis not present

## 2021-11-26 DIAGNOSIS — D509 Iron deficiency anemia, unspecified: Secondary | ICD-10-CM | POA: Diagnosis not present

## 2022-01-20 ENCOUNTER — Emergency Department (HOSPITAL_COMMUNITY): Payer: Medicare Other

## 2022-01-20 ENCOUNTER — Encounter (HOSPITAL_COMMUNITY): Payer: Self-pay

## 2022-01-20 ENCOUNTER — Emergency Department (HOSPITAL_COMMUNITY)
Admission: EM | Admit: 2022-01-20 | Discharge: 2022-01-20 | Disposition: A | Discharge status: Home / Self Care | Payer: Medicare Other | Attending: Emergency Medicine | Admitting: Emergency Medicine

## 2022-01-20 DIAGNOSIS — I129 Hypertensive chronic kidney disease with stage 1 through stage 4 chronic kidney disease, or unspecified chronic kidney disease: Secondary | ICD-10-CM | POA: Diagnosis not present

## 2022-01-20 DIAGNOSIS — Z79899 Other long term (current) drug therapy: Secondary | ICD-10-CM | POA: Insufficient documentation

## 2022-01-20 DIAGNOSIS — R079 Chest pain, unspecified: Secondary | ICD-10-CM

## 2022-01-20 DIAGNOSIS — E039 Hypothyroidism, unspecified: Secondary | ICD-10-CM | POA: Insufficient documentation

## 2022-01-20 DIAGNOSIS — Z96619 Presence of unspecified artificial shoulder joint: Secondary | ICD-10-CM | POA: Insufficient documentation

## 2022-01-20 DIAGNOSIS — N183 Chronic kidney disease, stage 3 unspecified: Secondary | ICD-10-CM | POA: Diagnosis not present

## 2022-01-20 DIAGNOSIS — R0789 Other chest pain: Secondary | ICD-10-CM | POA: Insufficient documentation

## 2022-01-20 LAB — CBC
HCT: 36.9 % (ref 36.0–46.0)
Hemoglobin: 11.6 g/dL — ABNORMAL LOW (ref 12.0–15.0)
MCH: 29.9 pg (ref 26.0–34.0)
MCHC: 31.4 g/dL (ref 30.0–36.0)
MCV: 95.1 fL (ref 80.0–100.0)
Platelets: 254 10*3/uL (ref 150–400)
RBC: 3.88 MIL/uL (ref 3.87–5.11)
RDW: 12.9 % (ref 11.5–15.5)
WBC: 6.4 10*3/uL (ref 4.0–10.5)
nRBC: 0 % (ref 0.0–0.2)

## 2022-01-20 LAB — BASIC METABOLIC PANEL
Anion gap: 10 (ref 5–15)
BUN: 19 mg/dL (ref 8–23)
CO2: 26 mmol/L (ref 22–32)
Calcium: 9.2 mg/dL (ref 8.9–10.3)
Chloride: 99 mmol/L (ref 98–111)
Creatinine, Ser: 0.81 mg/dL (ref 0.44–1.00)
GFR, Estimated: >60 mL/min (ref >60)
Glucose, Bld: 107 mg/dL — ABNORMAL HIGH (ref 70–99)
Potassium: 4.1 mmol/L (ref 3.5–5.1)
Sodium: 135 mmol/L (ref 135–145)

## 2022-01-20 LAB — TROPONIN I (HIGH SENSITIVITY): Troponin I (High Sensitivity): 6 ng/L (ref <18)

## 2022-01-20 MED ORDER — ACETAMINOPHEN 500 MG PO TABS: 1000.0000 mg | ORAL_TABLET | Freq: Four times a day (QID) | ORAL | Status: DC | PRN

## 2022-01-20 NOTE — ED Provider Triage Note (Signed)
Emergency Medicine Provider Triage Evaluation Note  Sasha Rogel , a 87 y.o. female  was evaluated in triage.  Pt complains of sharp chest pains.  She states she has been getting these pains over the past couple weeks.  Is a sharp left-sided chest pain that lasts approximately 1 second and then completely resolves.  States she typically gets them just prior to going to bed, however yesterday she had 4 of these episodes which is abnormal.  She then had an episode this morning which she describes as more painful than they usually are which prompted her to present today.  Patient has seen cardiology in the past.  Review of Systems  Positive: As above Negative: Shortness of breath, dizziness, diaphoresis  Physical Exam  BP (!) 169/81 (BP Location: Right Arm)   Pulse 70   Temp 97.9 F (36.6 C) (Oral)   Resp 16   SpO2 100%  Gen:   Awake, no distress   Resp:  Normal effort lungs CTA B MSK:   Moves extremities without difficulty  Other:    Medical Decision Making  Medically screening exam initiated at 10:19 AM.  Appropriate orders placed.  Nashayla Telleria was informed that the remainder of the evaluation will be completed by another provider, this initial triage assessment does not replace that evaluation, and the importance of remaining in the ED until their evaluation is complete.     Roylene Reason, [place] 01/20/22 1021

## 2022-01-20 NOTE — ED Provider Notes (Signed)
[facility] Provider Note   CSN: [id number] Arrival date & time: 01/20/22  7829     History  No chief complaint on file.   Kalise Fickett is a 87 y.o. female with mitral regurg, GERD, history of nephrolithiasis, spinal stenosis, status post shoulder replacement, DJD, HTN, hypothyroidism, HLD, history of rectal bleeding, stage III CKD, presents with chest pain.   Pt complains of sharp chest pains.  She states she has been getting these pains over the past couple weeks.  Is a sharp left-sided chest pain that lasts approximately 1 second and then completely resolves.  States she typically gets them just prior to going to bed, or while she's in bed, always sat rest. Never with exertion. Yesterday she had 4 of these episodes which is abnormal.  She then had an episode this morning which she describes as more painful than they usually are which prompted her to present today. Denies any associated lightheadedness, palpitations, SOB, nausea/vomiting, diaphoresis, radiation, exertional symptoms. No leg swelling, recent travel/hospitalizations/surgeries, no tobacco use, no hormone use.   Per chart review, saw cardiology for atypical chest pain, had echo in 05/2021 that demonstrated moderate MR and lexiscan 04/2021 with no remarkable findings.     HPI     Home Medications Prior to Admission medications   Medication Sig Start Date End Date Taking? Authorizing Provider  ascorbic acid (VITAMIN C) 500 MG tablet Take 1,500 mg by mouth daily.    [provider]  atenolol (TENORMIN) 25 MG tablet Take 25 mg by mouth daily.      [provider]  cholecalciferol (VITAMIN D3) 25 MCG (1000 UNIT) tablet Take 2,000 Units by mouth daily.    [provider]  DANDELION ROOT PO Take 1 capsule by mouth daily.    [provider]  DOCUSATE SODIUM PO Take 1-2 capsules by mouth as needed (constipation).    [provider]  Glucosamine  HCl 1000 MG TABS Take 1,000 mg by mouth in the morning and at bedtime.    [provider]  irbesartan (AVAPRO) 150 MG tablet Take 150 mg by mouth daily. 02/23/20   [provider]  levothyroxine (EUTHYROX) 125 MCG tablet Take 125 mcg by mouth daily. 03/06/20   [provider]  Magnesium 250 MG TABS Take 250 mg by mouth daily.    [provider]  Multiple Vitamins-Minerals (MULTIVITAMINS THER. W/MINERALS) TABS Take 1 tablet by mouth daily.      [provider]  Multiple Vitamins-Minerals (PRESERVISION AREDS) CAPS Take 1 capsule by mouth 2 (two) times daily.      [provider]  Omega-3 Fatty Acids (FISH OIL) 1200 MG CAPS Take 1,200 mg by mouth daily.    [provider]  Probiotic Product (PROBIOTIC PO) Take 1 capsule by mouth daily.    [provider]  Spirulina 500 MG TABS Take 500 mg by mouth daily.    [provider]  zinc gluconate 50 MG tablet Take 50 mg by mouth daily.    [provider]      Allergies    Dm-guaifenesin er, Doxycycline, Codeine, Fentanyl and related, Meclizine, Midazolam, Sulfa antibiotics, and Tramadol    Review of Systems   Review of Systems Review of systems negative for exertional symptoms.  A 10 point review of systems was performed and is negative unless otherwise reported in HPI.  Physical Exam Updated Vital Signs BP 130/83   Pulse 72   Temp 97.8 F (36.6  C)   Resp 12   SpO2 100%  Physical Exam General: Normal appearing female, lying in bed.  HEENT: Sclera anicteric, MMM, trachea midline. Cardiology: RRR, 3/6 systolic murmur. BL radial and DP pulses equal bilaterally.  Resp: Normal respiratory rate and effort. CTAB, no wheezes, rhonchi, crackles.  Abd: Soft, non-tender, non-distended. No rebound tenderness or guarding.  MSK: No peripheral edema or signs of trauma. Extremities without deformity or TTP. No cyanosis or clubbing. Skin: warm, dry. No rashes or  lesions. Neuro: A&Ox4, CNs II-XII grossly intact. MAEs. Sensation grossly intact.  Psych: Normal mood and affect.   ED Results / Procedures / Treatments   Labs (all labs ordered are listed, but only abnormal results are displayed) Labs Reviewed  BASIC METABOLIC PANEL - Abnormal; Notable for the following components:      Result Value   Glucose, Bld 107 (*)    All other components within normal limits  CBC - Abnormal; Notable for the following components:   Hemoglobin 11.6 (*)    All other components within normal limits  TROPONIN I (HIGH SENSITIVITY)  TROPONIN I (HIGH SENSITIVITY)    EKG EKG Interpretation  Date/Time:  Wednesday January 20 2022 10:03:44 EDT Ventricular Rate:  74 PR Interval:  166 QRS Duration: 84 QT Interval:  394 QTC Calculation: 437 R Axis:   70 Text Interpretation: Normal sinus rhythm Normal ECG When compared with ECG of 14-Dec-2015 12:52, PREVIOUS ECG IS PRESENT Confirmed by Cindee Lame [phone]) on 01/20/2022 11:08:42 AM  Radiology DG Chest 2 View  Result Date: 01/20/2022 CLINICAL DATA:  Chest pain EXAM: CHEST - 2 VIEW COMPARISON:  Chest x-ray 12/14/2015 FINDINGS: The cardiac silhouette, mediastinal hilar contours are within normal limits. The lungs are clear of an acute process. No pleural effusions or pulmonary lesions. The bony thorax is intact. Stable bilateral total shoulder arthroplasties. IMPRESSION: No acute cardiopulmonary findings. Electronically Signed   By: Marijo Sanes M.D.   On: 01/20/2022 10:30    Procedures Procedures    Medications Ordered in ED Medications  acetaminophen (TYLENOL) tablet 1,000 mg (has no administration in time range)    ED Course/ Medical Decision Making/ A&P                          Medical Decision Making Amount and/or Complexity of Data Reviewed Labs: ordered. Decision-making details documented in ED Course. Radiology: ordered. Decision-making details documented in ED Course.  Risk OTC drugs.   This  patient presents with chest pain, with symptoms suggestive of noncardiac chest pain. History without high risk features (e.g., not substernal, no exertional component, not relieved with rest, no radiation/diaphoresis/vomiting). Patient with recent negative stress test (<1 year). Exam without evidence of volume overload. EKG without signs of active ischemia. HEART score: 3 points for age and risk factors. Troponin 6, unlikely NSTEMI. Presentation not consistent with acute PE (Wells low risk  // cannot PERC out d/t age but no SOB, no risk factors), pneumothorax, thoracic arotic dissection, cardiac effusion or tamponade. CXR unremarkable. Patient has known mitral regurg, no HF symptoms to suggest worsening MR, and exam c/w prior documented by cardiology in clinic.   I have personally reviewed and interpreted all labs and imaging.   Clinical Course as of 01/20/22 1238  Wed Jan 20, 2022  1109 EKG  Rhythm: normal sinus rhythm  QRS Axis: normal  Intervals: normal  ST/T Wave abnormalities: normal  Conduction Disutrbances: none  Narrative Interpretation: unremarkable  [HN]  1109  DG Chest 2 View No acute cardiopulmonary findings. [HN]  1109 CBC(!) Slight anemia [HN]  6153 Basic metabolic panel(!) unremarkable [HN]  1209 Troponin I (High Sensitivity): 6 [HN]    Clinical Course User Index [HN] Audley Hose, MD   Patient feels normal now. Given reassuring workup, patient will be DC'd with instructions to follow-up with cardiology.  She already has an appointment scheduled in November but I asked her to call this afternoon and ask at a sooner appointment.  Patient and her daughter at bedside report understanding, all questions answered to patient's satisfaction.  Dispo: DC         Final Clinical Impression(s) / ED Diagnoses Final diagnoses:  Chest pain of unknown etiology    Rx / DC Orders ED Discharge Orders     None        This note was created using dictation software,  which may contain spelling or grammatical errors.    Audley Hose, MD 01/20/22 1239

## 2022-01-20 NOTE — ED Triage Notes (Signed)
Patient complains of sharp chest pain that started this am x 2 episodes and now reports chest dullness for several months. Patient alert and oriented

## 2022-01-20 NOTE — Discharge Instructions (Addendum)
Thank you for coming to [facility] Emergency Department. You were seen for chest pain. We did an exam, labs, and imaging, and these showed no acute findings.  Please follow up with your primary care provider within 1 week. Please call your cardiologist today to try to move up your November appointment for within the next 1 to 2 weeks.  Do not hesitate to return to the ED or call 911 if you experience: -Worsening symptoms -Chest pain associated with sweating, nausea and vomiting, shortness of breath, or palpitations -Lightheadedness, passing out -Fevers/chills -Anything else that concerns you

## 2022-01-26 NOTE — Progress Notes (Unsigned)
Cardiology Office Note:    Date:  01/27/2022   ID:  Patricia [name], DOB 1934-11-21, MRN [id number]  PCP:  Shirline Frees, MD  [facility] Providers Cardiologist:  None     Referring MD: Shirline Frees, MD   Chief Complaint:  Chest Pain     History of Present Illness:   Patricia [name] is a 87 y.o. 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 Geraldo Pitter 05/01/21 with atypical chest pain. NST was normal and echo showed mild-mod MR, normal LVEF grade 2 DD.  Patient was in ED 01/20/22 with atypical chest pain at rest. EKG unchanged, troponin normal.  Patient comes in for ED f/u. She had sharp shooting chest pain like a flash. Now has dull ache under her breast when she lays down at night. Does a lot of yard work and never has any chest pain.  Has lost 10-13 lbs and keeping BP down. Has been doing intermittent fasting.  Has history of diaphragmatic hernia and she wonders if that has worsened. Has some vertigo. Irbestartan reduced because BP had gotten too low.     Past Medical History:  Diagnosis Date   Abnormal gait 04/24/2021   Age-related macular degeneration, dry, both eyes 03/08/2016   Allergies    Anemia    Arthritis    Bilateral presbyopia 03/05/2015   Chronic pain of left ankle 8/0/0349   Complication of anesthesia    nausea from fentayl; hypotension episode after previous shoulder surgery   Constipation    Decreased range of motion of ankle 04/24/2021   Degenerative arthritis of right shoulder region 03/06/2011   DJD (degenerative joint disease)    Essential (primary) hypertension    GERD (gastroesophageal reflux disease) 04/24/2021   GERD with esophagitis    Glaucoma    History of bronchitis    History of kidney stones    History of pneumonia    Hypertension    Hyperthyroidism    Impaired functional mobility, balance, and endurance 04/24/2021   Iron deficiency anemia    Left rotator cuff tear arthropathy 12/09/2015   Liver nodule    Low back pain     Macular degeneration disease    Nonexudative age-related macular degeneration 03/05/2015   Normal tension glaucoma of both eyes, mild stage 03/05/2015   Numbness    left foot and toes   Osteoarthritis    Other chest pain    Other hyperlipidemia    Postoperative anemia due to acute blood loss 03/10/2011   Had pre-existing anemia, now symptomatic with acute on chronic anemia.   Primary insomnia    Pseudophakia of both eyes 03/05/2015   Pure hypercholesterolemia    Rectal bleeding    Renal insufficiency    Rosacea    Rotator cuff tear, right 03/06/2011   S/P shoulder replacement 12/09/2015   Sleep disorder, unspecified    Spinal stenosis    Spinal stenosis    Spinal stenosis    Stage 3a chronic kidney disease (HCC)    Synovitis of left ankle 04/24/2021   Current Medications: Current Meds  Medication Sig   ascorbic acid (VITAMIN C) 500 MG tablet Take 1,500 mg by mouth daily.   atenolol (TENORMIN) 25 MG tablet Take 25 mg by mouth daily.     cholecalciferol (VITAMIN D3) 25 MCG (1000 UNIT) tablet Take 2,000 Units by mouth daily.   DANDELION ROOT PO Take 1 capsule by mouth daily.   DOCUSATE SODIUM PO Take 1-2 capsules by mouth as needed (  constipation).   Glucosamine HCl 1000 MG TABS Take 1,000 mg by mouth in the morning and at bedtime.   irbesartan (AVAPRO) 75 MG tablet Take 75 mg by mouth daily.   levothyroxine (EUTHYROX) 125 MCG tablet Take 125 mcg by mouth daily.   Magnesium 250 MG TABS Take 250 mg by mouth daily.   Multiple Vitamins-Minerals (MULTIVITAMINS THER. W/MINERALS) TABS Take 1 tablet by mouth daily.     Multiple Vitamins-Minerals (PRESERVISION AREDS) CAPS Take 1 capsule by mouth 2 (two) times daily.     Omega-3 Fatty Acids (FISH OIL) 1200 MG CAPS Take 1,200 mg by mouth daily.   Probiotic Product (PROBIOTIC PO) Take 1 capsule by mouth daily.   Spirulina 500 MG TABS Take 500 mg by mouth daily.   zinc gluconate 50 MG tablet Take 50 mg by mouth daily.    Allergies:    Dm-guaifenesin er, Doxycycline, Codeine, Fentanyl and related, Meclizine, Midazolam, Sulfa antibiotics, and Tramadol   Social History   Tobacco Use   Smoking status: Never   Smokeless tobacco: Never  Vaping Use   Vaping Use: Never used  Substance Use Topics   Alcohol use: No   Drug use: No    Family Hx: The patient's Family history is unknown by patient.  ROS     Physical Exam:    VS:  BP 130/78   Pulse 68   Ht 5' (1.524 m)   Wt 128 lb (58.1 kg)   SpO2 94%   BMI 25.00 kg/m     Wt Readings from Last 3 Encounters:  01/27/22 128 lb (58.1 kg)  05/19/21 145 lb (65.8 kg)  05/01/21 145 lb 1.3 oz (65.8 kg)    Physical Exam  GEN: Well nourished, well developed, in no acute distress  Neck: no JVD, carotid bruits, or masses Cardiac:RRR; 2/6 systolic murmur LSB Respiratory:  clear to auscultation bilaterally, normal work of breathing GI: soft, nontender, nondistended, + BS Ext: without cyanosis, clubbing, or edema, Good distal pulses bilaterally Neuro:  Alert and Oriented x 3,  Psych: euthymic mood, full affect        EKGs/Labs/Other Test Reviewed:    EKG:  EKG is not ordered today   Recent Labs: 01/20/2022: BUN 19; Creatinine, Ser 0.81; Hemoglobin 11.6; Platelets 254; Potassium 4.1; Sodium 135   Recent Lipid Panel No results for input(s): "CHOL", "TRIG", "HDL", "VLDL", "LDLCALC", "LDLDIRECT" in the last 8760 hours.   Prior CV Studies:   Echo 05/2021 IMPRESSIONS     1. Left ventricular ejection fraction, by estimation, is 60 to 65%. The  left ventricle has normal function. The left ventricle has no regional  wall motion abnormalities. There is mild concentric left ventricular  hypertrophy. Left ventricular diastolic  parameters are consistent with Grade II diastolic dysfunction  (pseudonormalization). Elevated left atrial pressure.   2. Right ventricular systolic function is normal. The right ventricular  size is normal. There is normal pulmonary artery  systolic pressure.   3. Left atrial size was mildly dilated.   4. The mitral valve is degenerative. Mild to moderate mitral valve  regurgitation. No evidence of mitral stenosis.   5. The aortic valve is normal in structure. Aortic valve regurgitation is  mild. Aortic valve sclerosis/calcification is present, without any  evidence of aortic stenosis.   6. The inferior vena cava is normal in size with greater than 50%  respiratory variability, suggesting right atrial pressure of 3 mmHg.   NST 04/2021   The study is normal. The study  is low risk.   No ST deviation was noted.   Left ventricular function is normal. Nuclear stress EF: 78 %. The left ventricular ejection fraction is hyperdynamic (>65%). End diastolic cavity size is normal.   Prior study not available for comparison.   Low risk stress nuclear study with normal perfusion and normal left ventricular regional and global systolic function.      Risk Assessment/Calculations/Metrics:              ASSESSMENT & PLAN:   No problem-specific Assessment & Plan notes found for this encounter.   Chest pain in ED 01/20/22 atypical, no EKG changes normal NST 05/2021. Chest pain atypical at rest. No further work up  Mod MR no symptoms, continue to monitor. F/u with Dr. Geraldo Pitter.  HTN-much better controlled since weight loss and irbesartan reduced 75 mg daily. She'll continue to monitor at home.  HLD managed by PCP          Dispo:  No follow-ups on file.   Medication Adjustments/Labs and Tests Ordered: Current medicines are reviewed at length with the patient today.  Concerns regarding medicines are outlined above.  Tests Ordered: No orders of the defined types were placed in this encounter.  Medication Changes: No orders of the defined types were placed in this encounter.  Signed, Ermalinda Barrios, PA-C  01/27/2022 8:38 AM    [facility] [address], [place], [place]  [address] Phone: [phone]; Fax: [phone]

## 2022-01-27 ENCOUNTER — Encounter: Payer: Self-pay | Admitting: Physician Assistant

## 2022-01-27 ENCOUNTER — Ambulatory Visit: Payer: Medicare Other | Attending: Physician Assistant | Admitting: Physician Assistant

## 2022-01-27 VITALS — BP 130/78 | HR 68 | Ht 60.0 in | Wt 128.0 lb

## 2022-01-27 DIAGNOSIS — I10 Essential (primary) hypertension: Secondary | ICD-10-CM | POA: Diagnosis not present

## 2022-01-27 DIAGNOSIS — I340 Nonrheumatic mitral (valve) insufficiency: Secondary | ICD-10-CM | POA: Diagnosis not present

## 2022-01-27 DIAGNOSIS — I2584 Coronary atherosclerosis due to calcified coronary lesion: Secondary | ICD-10-CM

## 2022-01-27 DIAGNOSIS — R079 Chest pain, unspecified: Secondary | ICD-10-CM | POA: Diagnosis not present

## 2022-01-27 DIAGNOSIS — E7800 Pure hypercholesterolemia, unspecified: Secondary | ICD-10-CM | POA: Diagnosis not present

## 2022-01-27 NOTE — Patient Instructions (Signed)
Medication Instructions:  Your physician recommends that you continue on your current medications as directed. Please refer to the Current Medication list given to you today.  *If you need a refill on your cardiac medications before your next appointment, please call your pharmacy*   Lab Work: None If you have labs (blood work) drawn today and your tests are completely normal, you will receive your results only by: [facility] (if you have MyChart) OR A paper copy in the mail If you have any lab test that is abnormal or we need to change your treatment, we will call you to review the results.   Follow-Up: At [facility], you and your health needs are our priority.  As part of our continuing mission to provide you with exceptional heart care, we have created designated Provider Care Teams.  These Care Teams include your primary Cardiologist (physician) and Advanced Practice Providers (APPs -  Physician Assistants and Nurse Practitioners) who all work together to provide you with the care you need, when you need it.   Your next appointment:   6 month(s)  The format for your next appointment:   In Person  Provider:   Jyl Heinz, MD   Important Information About [place]

## 2022-02-02 DIAGNOSIS — H903 Sensorineural hearing loss, bilateral: Secondary | ICD-10-CM | POA: Diagnosis not present

## 2022-02-02 DIAGNOSIS — H8111 Benign paroxysmal vertigo, right ear: Secondary | ICD-10-CM | POA: Diagnosis not present

## 2022-02-02 DIAGNOSIS — R42 Dizziness and giddiness: Secondary | ICD-10-CM | POA: Diagnosis not present

## 2022-02-02 DIAGNOSIS — H838X3 Other specified diseases of inner ear, bilateral: Secondary | ICD-10-CM | POA: Diagnosis not present

## 2022-02-17 ENCOUNTER — Ambulatory Visit: Payer: Medicare Other | Admitting: Cardiology

## 2022-03-02 DIAGNOSIS — H903 Sensorineural hearing loss, bilateral: Secondary | ICD-10-CM | POA: Diagnosis not present

## 2022-03-02 DIAGNOSIS — H8111 Benign paroxysmal vertigo, right ear: Secondary | ICD-10-CM | POA: Diagnosis not present

## 2022-03-02 DIAGNOSIS — R42 Dizziness and giddiness: Secondary | ICD-10-CM | POA: Diagnosis not present

## 2022-03-18 DIAGNOSIS — L570 Actinic keratosis: Secondary | ICD-10-CM | POA: Diagnosis not present

## 2022-03-18 DIAGNOSIS — X32XXXA Exposure to sunlight, initial encounter: Secondary | ICD-10-CM | POA: Diagnosis not present

## 2022-03-18 DIAGNOSIS — L578 Other skin changes due to chronic exposure to nonionizing radiation: Secondary | ICD-10-CM | POA: Diagnosis not present

## 2022-03-19 DIAGNOSIS — H401131 Primary open-angle glaucoma, bilateral, mild stage: Secondary | ICD-10-CM | POA: Diagnosis not present

## 2022-03-24 DIAGNOSIS — M1711 Unilateral primary osteoarthritis, right knee: Secondary | ICD-10-CM | POA: Diagnosis not present

## 2022-03-24 DIAGNOSIS — M1611 Unilateral primary osteoarthritis, right hip: Secondary | ICD-10-CM | POA: Diagnosis not present

## 2022-03-24 DIAGNOSIS — M545 Low back pain, unspecified: Secondary | ICD-10-CM | POA: Diagnosis not present

## 2022-03-31 ENCOUNTER — Telehealth: Payer: Self-pay

## 2022-03-31 DIAGNOSIS — R42 Dizziness and giddiness: Secondary | ICD-10-CM | POA: Diagnosis not present

## 2022-03-31 DIAGNOSIS — H608X3 Other otitis externa, bilateral: Secondary | ICD-10-CM | POA: Diagnosis not present

## 2022-03-31 NOTE — Telephone Encounter (Signed)
   Name: Patricia [name]  DOB: 1934/05/24  MRN: [id number]  Primary Cardiologist: None  Chart reviewed as part of pre-operative protocol coverage. Because of [name] past medical history and time since last visit, she will require a follow-up phone visit in order to better assess preoperative cardiovascular risk.  Pre-op covering staff: - Please schedule appointment and call patient to inform them. If patient already had an upcoming appointment within acceptable timeframe, please add "pre-op clearance" to the appointment notes so provider is aware. - Please contact requesting surgeon's office via preferred method (i.e, phone, fax) to inform them of need for appointment prior to surgery.  Loel Dubonnet, NP  03/31/2022, 11:45 AM

## 2022-03-31 NOTE — Telephone Encounter (Signed)
   Pre-operative Risk Assessment    Patient Name: Patricia [name]  DOB: [birth year] MRN: [id number]      Request for Surgical Clearance    Procedure:   right total knee replacement  Date of Surgery:  Clearance TBD                                 Surgeon:  Dr. Marchia Bond Surgeon's Group or Practice Name:  [facility] Phone number:  [phone] Fax number:  [phone]   Type of Clearance Requested:   - Medical    Type of Anesthesia:  Spinal   Additional requests/questions:    SignedLowella Grip   03/31/2022, 10:45 AM

## 2022-04-01 ENCOUNTER — Telehealth: Payer: Self-pay

## 2022-04-01 NOTE — Telephone Encounter (Signed)
  Patient Consent for Virtual Visit         Patricia [name] has provided verbal consent on 04/01/2022 for a virtual visit (video or telephone).   CONSENT FOR VIRTUAL VISIT FOR:  Patricia [name]  By participating in this virtual visit I agree to the following:  I hereby voluntarily request, consent and authorize [facility] and its employed or contracted physicians, physician assistants, nurse practitioners or other licensed health care professionals (the Practitioner), to provide me with telemedicine health care services (the "Services") as deemed necessary by the treating Practitioner. I acknowledge and consent to receive the Services by the Practitioner via telemedicine. I understand that the telemedicine visit will involve communicating with the Practitioner through live audiovisual communication technology and the disclosure of certain medical information by electronic transmission. I acknowledge that I have been given the opportunity to request an in-person assessment or other available alternative prior to the telemedicine visit and am voluntarily participating in the telemedicine visit.  I understand that I have the right to withhold or withdraw my consent to the use of telemedicine in the course of my care at any time, without affecting my right to future care or treatment, and that the Practitioner or I may terminate the telemedicine visit at any time. I understand that I have the right to inspect all information obtained and/or recorded in the course of the telemedicine visit and may receive copies of available information for a reasonable fee.  I understand that some of the potential risks of receiving the Services via telemedicine include:  Delay or interruption in medical evaluation due to technological equipment failure or disruption; Information transmitted may not be sufficient (e.g. poor resolution of images) to allow for appropriate medical decision making by the Practitioner;  and/or  In rare instances, security protocols could fail, causing a breach of personal health information.  Furthermore, I acknowledge that it is my responsibility to provide information about my medical history, conditions and care that is complete and accurate to the best of my ability. I acknowledge that Practitioner's advice, recommendations, and/or decision may be based on factors not within their control, such as incomplete or inaccurate data provided by me or distortions of diagnostic images or specimens that may result from electronic transmissions. I understand that the practice of medicine is not an exact science and that Practitioner makes no warranties or guarantees regarding treatment outcomes. I acknowledge that a copy of this consent can be made available to me via my patient portal ([facility]), or I can request a printed copy by calling the office of [facility].    I understand that my insurance will be billed for this visit.   I have read or had this consent read to me. I understand the contents of this consent, which adequately explains the benefits and risks of the Services being provided via telemedicine.  I have been provided ample opportunity to ask questions regarding this consent and the Services and have had my questions answered to my satisfaction. I give my informed consent for the services to be provided through the use of telemedicine in my medical care

## 2022-04-01 NOTE — Telephone Encounter (Signed)
Pt states that she does not plan on having surgery until March. Pt stated that she prefers to have phone visit in February. Pt scheduled for tele visit on 06/03/21

## 2022-04-21 DIAGNOSIS — H353223 Exudative age-related macular degeneration, left eye, with inactive scar: Secondary | ICD-10-CM | POA: Diagnosis not present

## 2022-04-21 DIAGNOSIS — H43811 Vitreous degeneration, right eye: Secondary | ICD-10-CM | POA: Diagnosis not present

## 2022-04-21 DIAGNOSIS — H353112 Nonexudative age-related macular degeneration, right eye, intermediate dry stage: Secondary | ICD-10-CM | POA: Diagnosis not present

## 2022-05-06 DIAGNOSIS — E7800 Pure hypercholesterolemia, unspecified: Secondary | ICD-10-CM | POA: Diagnosis not present

## 2022-05-06 DIAGNOSIS — R42 Dizziness and giddiness: Secondary | ICD-10-CM | POA: Diagnosis not present

## 2022-05-06 DIAGNOSIS — E039 Hypothyroidism, unspecified: Secondary | ICD-10-CM | POA: Diagnosis not present

## 2022-05-06 DIAGNOSIS — M150 Primary generalized (osteo)arthritis: Secondary | ICD-10-CM | POA: Diagnosis not present

## 2022-05-06 DIAGNOSIS — Z0000 Encounter for general adult medical examination without abnormal findings: Secondary | ICD-10-CM | POA: Diagnosis not present

## 2022-05-06 DIAGNOSIS — I10 Essential (primary) hypertension: Secondary | ICD-10-CM | POA: Diagnosis not present

## 2022-05-06 DIAGNOSIS — J3089 Other allergic rhinitis: Secondary | ICD-10-CM | POA: Diagnosis not present

## 2022-05-07 LAB — LAB REPORT - SCANNED: EGFR: 70

## 2022-06-03 ENCOUNTER — Ambulatory Visit: Payer: Medicare Other

## 2022-06-24 DIAGNOSIS — R202 Paresthesia of skin: Secondary | ICD-10-CM | POA: Diagnosis not present

## 2022-06-24 DIAGNOSIS — D225 Melanocytic nevi of trunk: Secondary | ICD-10-CM | POA: Diagnosis not present

## 2022-06-24 DIAGNOSIS — Z7189 Other specified counseling: Secondary | ICD-10-CM | POA: Diagnosis not present

## 2022-06-24 DIAGNOSIS — L821 Other seborrheic keratosis: Secondary | ICD-10-CM | POA: Diagnosis not present

## 2022-06-24 DIAGNOSIS — L578 Other skin changes due to chronic exposure to nonionizing radiation: Secondary | ICD-10-CM | POA: Diagnosis not present

## 2022-06-24 DIAGNOSIS — L814 Other melanin hyperpigmentation: Secondary | ICD-10-CM | POA: Diagnosis not present

## 2022-06-24 DIAGNOSIS — D485 Neoplasm of uncertain behavior of skin: Secondary | ICD-10-CM | POA: Diagnosis not present

## 2022-06-24 DIAGNOSIS — L570 Actinic keratosis: Secondary | ICD-10-CM | POA: Diagnosis not present

## 2022-06-24 DIAGNOSIS — X32XXXA Exposure to sunlight, initial encounter: Secondary | ICD-10-CM | POA: Diagnosis not present

## 2022-07-08 DIAGNOSIS — L820 Inflamed seborrheic keratosis: Secondary | ICD-10-CM | POA: Diagnosis not present

## 2022-07-08 DIAGNOSIS — L538 Other specified erythematous conditions: Secondary | ICD-10-CM | POA: Diagnosis not present

## 2022-07-08 DIAGNOSIS — L298 Other pruritus: Secondary | ICD-10-CM | POA: Diagnosis not present

## 2022-07-08 DIAGNOSIS — L570 Actinic keratosis: Secondary | ICD-10-CM | POA: Diagnosis not present

## 2022-07-21 DIAGNOSIS — M1611 Unilateral primary osteoarthritis, right hip: Secondary | ICD-10-CM | POA: Diagnosis not present

## 2022-07-21 DIAGNOSIS — M1711 Unilateral primary osteoarthritis, right knee: Secondary | ICD-10-CM | POA: Diagnosis not present

## 2022-08-03 DIAGNOSIS — M1990 Unspecified osteoarthritis, unspecified site: Secondary | ICD-10-CM | POA: Insufficient documentation

## 2022-08-05 ENCOUNTER — Ambulatory Visit: Payer: Medicare Other | Admitting: Cardiology

## 2022-08-10 ENCOUNTER — Encounter: Payer: Self-pay | Admitting: Cardiology

## 2022-08-10 ENCOUNTER — Ambulatory Visit: Payer: Medicare Other | Attending: Cardiology | Admitting: Cardiology

## 2022-08-10 VITALS — BP 128/60 | HR 84 | Ht 60.0 in | Wt 131.0 lb

## 2022-08-10 DIAGNOSIS — I700 Atherosclerosis of aorta: Secondary | ICD-10-CM | POA: Diagnosis not present

## 2022-08-10 DIAGNOSIS — E7800 Pure hypercholesterolemia, unspecified: Secondary | ICD-10-CM | POA: Insufficient documentation

## 2022-08-10 DIAGNOSIS — I10 Essential (primary) hypertension: Secondary | ICD-10-CM | POA: Insufficient documentation

## 2022-08-10 DIAGNOSIS — I351 Nonrheumatic aortic (valve) insufficiency: Secondary | ICD-10-CM

## 2022-08-10 HISTORY — DX: Nonrheumatic aortic (valve) insufficiency: I35.1

## 2022-08-10 NOTE — Progress Notes (Signed)
Cardiology Office Note:    Date:  08/10/2022   ID:  Patricia [name], DOB 1934/11/07, MRN [id number]  PCP:  Johny Blamer, MD  Cardiologist:  Garwin Brothers, MD   Referring MD: Johny Blamer, MD    ASSESSMENT:    1. Aortic atherosclerosis   2. Essential (primary) hypertension   3. Moderate aortic regurgitation   4. Pure hypercholesterolemia    PLAN:    In order of problems listed above:  Primary prevention stressed with the patient.  Importance of compliance with diet medication stressed and she vocalized understanding Essential hypertension: Blood pressure is stable and diet was emphasized Mixed dyslipidemia: Not keen on lipid-lowering medications.  I had extensive talk with her about the benefits and risks and she vocalized understanding and questions were answered to her satisfaction Aortic atherosclerosis: Stable. Mild to moderate aortic regurgitation: Stable and we will follow-up with an echocardiogram.  I discussed findings with her at length and questions were answered to her satisfaction. Patient will be seen in follow-up appointment in 6 months or earlier if the patient has any concerns.    Medication Adjustments/Labs and Tests Ordered: Current medicines are reviewed at length with the patient today.  Concerns regarding medicines are outlined above.  Orders Placed This Encounter  Procedures   ECHOCARDIOGRAM COMPLETE   No orders of the defined types were placed in this encounter.    No chief complaint on file.    History of Present Illness:    Patricia [name] is a 88 y.o. female.  Patient has past medical history of regurgitation, essential hypertension, mixed dyslipidemia.  She denies any problems at this time and takes care of activities of daily living.  No chest pain orthopnea or PND.  At the time of my evaluation, the patient is alert awake oriented and in no distress.  She walks on a regular basis at least 15 to 20 minutes a day.  Past Medical History:   Diagnosis Date   Abnormal gait 04/24/2021   Age-related macular degeneration, dry, both eyes 03/08/2016   Allergies    Anemia    Aortic atherosclerosis 05/01/2021   Arthritis    Bilateral presbyopia 03/05/2015   Chest pain of uncertain etiology 05/01/2021   Chronic pain of left ankle 04/24/2021   Complication of anesthesia    nausea from fentayl; hypotension episode after previous shoulder surgery   Constipation    Decreased range of motion of ankle 04/24/2021   Degenerative arthritis of right shoulder region 03/06/2011   DJD (degenerative joint disease)    Essential (primary) hypertension    Gastro-esophageal reflux disease without esophagitis 04/24/2021   GERD with esophagitis    Glaucoma    History of bronchitis    History of kidney stones    History of pneumonia    Hyperlipidemia 04/24/2021   Hypertension    Hyperthyroidism    Impaired functional mobility, balance, and endurance 04/24/2021   Iron deficiency anemia    Left rotator cuff tear arthropathy 12/09/2015   Liver nodule    Localized, primary osteoarthritis of shoulder region 05/01/2021   Low back pain    Lower abdominal pain 05/01/2021   Macular degeneration disease    Nonexudative age-related macular degeneration 03/05/2015   Normal tension glaucoma of both eyes, mild stage 03/05/2015   Numbness    left foot and toes   Osteoarthritis    Other chest pain    Postoperative anemia due to acute blood loss 03/10/2011   Had pre-existing anemia, now symptomatic with  acute on chronic anemia.   Primary insomnia    Primary osteoarthritis 04/24/2021   Pseudophakia of both eyes 03/05/2015   Pure hypercholesterolemia    Rectal bleeding    Renal insufficiency    Rosacea    Rotator cuff tear, right 03/06/2011   S/P shoulder replacement 12/09/2015   Sleep disorder, unspecified    Spinal stenosis    Spinal stenosis    Spinal stenosis    Stage 3a chronic kidney disease    Synovitis of left ankle 04/24/2021     Past Surgical History:  Procedure Laterality Date   ABDOMINAL HYSTERECTOMY     CARPAL TUNNEL RELEASE     DILATION AND CURETTAGE OF UTERUS     EYE SURGERY Bilateral    cataract   JOINT REPLACEMENT     lt knee-05   KNEE SURGERY     REVERSE SHOULDER ARTHROPLASTY  03/09/2011   Procedure: REVERSE SHOULDER ARTHROPLASTY;  Surgeon: Eulas Post;  Location: [facility];  Service: Orthopedics;  Laterality: Right;   REVERSE SHOULDER ARTHROPLASTY Left 12/09/2015   Procedure: REVERSE TOTAL SHOULDER ARTHROPLASTY;  Surgeon: Teryl Lucy, MD;  Location: [facility] OR;  Service: Orthopedics;  Laterality: Left;   ROTATOR CUFF REPAIR, left      Current Medications: Current Meds  Medication Sig   ascorbic acid (VITAMIN C) 500 MG tablet Take 1,500 mg by mouth daily.   cholecalciferol (VITAMIN D3) 25 MCG (1000 UNIT) tablet Take 2,000 Units by mouth daily.   DANDELION ROOT PO Take 1 capsule by mouth daily.   DOCUSATE SODIUM PO Take 1-2 capsules by mouth as needed (constipation).   Glucosamine HCl 1000 MG TABS Take 1,000 mg by mouth in the morning and at bedtime.   irbesartan (AVAPRO) 75 MG tablet Take 75 mg by mouth daily.   levothyroxine (EUTHYROX) 125 MCG tablet Take 125 mcg by mouth daily.   Magnesium 250 MG TABS Take 250 mg by mouth daily.   Multiple Vitamins-Minerals (MULTIVITAMINS THER. W/MINERALS) TABS Take 1 tablet by mouth daily.     Multiple Vitamins-Minerals (PRESERVISION AREDS) CAPS Take 1 capsule by mouth 2 (two) times daily.     Omega-3 Fatty Acids (FISH OIL) 1200 MG CAPS Take 1,200 mg by mouth daily.   Probiotic Product (PROBIOTIC PO) Take 1 capsule by mouth daily.   Spirulina 500 MG TABS Take 500 mg by mouth daily.   zinc gluconate 50 MG tablet Take 50 mg by mouth daily.     Allergies:   Dm-guaifenesin er, Doxycycline, Codeine, Fentanyl and related, Meclizine, Midazolam, Sulfa antibiotics, and Tramadol   Social History   Socioeconomic History   Marital status: Widowed     Spouse name: Not on file   Number of children: Not on file   Years of education: Not on file   Highest education level: Not on file  Occupational History   Not on file  Tobacco Use   Smoking status: Never   Smokeless tobacco: Never  Vaping Use   Vaping Use: Never used  Substance and Sexual Activity   Alcohol use: No   Drug use: No   Sexual activity: Not on file  Other Topics Concern   Not on file  Social History Narrative   Not on file   Social Determinants of Health   Financial Resource Strain: Not on file  Food Insecurity: Not on file  Transportation Needs: Not on file  Physical Activity: Not on file  Stress: Not on file  Social Connections: Not on  file     Family History: The patient's Family history is unknown by patient.  ROS:   Please see the history of present illness.    All other systems reviewed and are negative.  EKGs/Labs/Other Studies Reviewed:    The following studies were reviewed today: I discussed my findings with the patient and echo report done in the past   Recent Labs: 01/20/2022: BUN 19; Creatinine, Ser 0.81; Hemoglobin 11.6; Platelets 254; Potassium 4.1; Sodium 135  Recent Lipid Panel No results found for: "CHOL", "TRIG", "HDL", "CHOLHDL", "VLDL", "LDLCALC", "LDLDIRECT"  Physical Exam:    VS:  BP 128/60   Pulse 84   Ht 5' (1.524 m)   Wt 131 lb 0.6 oz (59.4 kg)   SpO2 95%   BMI 25.59 kg/m     Wt Readings from Last 3 Encounters:  08/10/22 131 lb 0.6 oz (59.4 kg)  01/27/22 128 lb (58.1 kg)  05/19/21 145 lb (65.8 kg)     GEN: Patient is in no acute distress HEENT: Normal NECK: No JVD; No carotid bruits LYMPHATICS: No lymphadenopathy CARDIAC: Hear sounds regular, 2/6 systolic murmur at the apex.  2/6 diastolic murmur at the aortic area RESPIRATORY:  Clear to auscultation without rales, wheezing or rhonchi  ABDOMEN: Soft, non-tender, non-distended MUSCULOSKELETAL:  No edema; No deformity  SKIN: Warm and dry NEUROLOGIC:   Alert and oriented x 3 PSYCHIATRIC:  Normal affect   Signed, Garwin Brothers, MD  08/10/2022 10:55 AM    [facility]

## 2022-08-10 NOTE — Patient Instructions (Signed)
Medication Instructions:  Your physician recommends that you continue on your current medications as directed. Please refer to the Current Medication list given to you today.  *If you need a refill on your cardiac medications before your next appointment, please call your pharmacy*   Lab Work: None ordered If you have labs (blood work) drawn today and your tests are completely normal, you will receive your results only by: MyChart Message (if you have MyChart) OR A paper copy in the mail If you have any lab test that is abnormal or we need to change your treatment, we will call you to review the results.   Testing/Procedures: Your physician has requested that you have an echocardiogram. Echocardiography is a painless test that uses sound waves to create images of your heart. It provides your doctor with information about the size and shape of your heart and how well your heart's chambers and valves are working. This procedure takes approximately one hour. There are no restrictions for this procedure. Please do NOT wear cologne, perfume, aftershave, or lotions (deodorant is allowed). Please arrive 15 minutes prior to your appointment time.     Follow-Up: At [facility], you and your health needs are our priority.  As part of our continuing mission to provide you with exceptional heart care, we have created designated Provider Care Teams.  These Care Teams include your primary Cardiologist (physician) and Advanced Practice Providers (APPs -  Physician Assistants and Nurse Practitioners) who all work together to provide you with the care you need, when you need it.  We recommend signing up for the patient portal called "MyChart".  Sign up information is provided on this After Visit Summary.  MyChart is used to connect with patients for Virtual Visits (Telemedicine).  Patients are able to view lab/test results, encounter notes, upcoming appointments, etc.  Non-urgent messages can be sent to  your provider as well.   To learn more about what you can do with MyChart, go to [email or web address].    Your next appointment:   12 month(s)  The format for your next appointment:   In Person  Provider:   Rajan Revankar, MD   Other Instructions Echocardiogram An echocardiogram is a test that uses sound waves (ultrasound) to produce images of the heart. Images from an echocardiogram can provide important information about: Heart size and shape. The size and thickness and movement of your heart's walls. Heart muscle function and strength. Heart valve function or if you have stenosis. Stenosis is when the heart valves are too narrow. If blood is flowing backward through the heart valves (regurgitation). A tumor or infectious growth around the heart valves. Areas of heart muscle that are not working well because of poor blood flow or injury from a heart attack. Aneurysm detection. An aneurysm is a weak or damaged part of an artery wall. The wall bulges out from the normal force of blood pumping through the body. Tell a health care provider about: Any allergies you have. All medicines you are taking, including vitamins, herbs, eye drops, creams, and over-the-counter medicines. Any blood disorders you have. Any surgeries you have had. Any medical conditions you have. Whether you are pregnant or may be pregnant. What are the risks? Generally, this is a safe test. However, problems may occur, including an allergic reaction to dye (contrast) that may be used during the test. What happens before the test? No specific preparation is needed. You may eat and drink normally. What happens during the test? You will   take off your clothes from the waist up and put on a hospital gown. Electrodes or electrocardiogram (ECG)patches may be placed on your chest. The electrodes or patches are then connected to a device that monitors your heart rate and rhythm. You will lie down on a table for an  ultrasound exam. A gel will be applied to your chest to help sound waves pass through your skin. A handheld device, called a transducer, will be pressed against your chest and moved over your heart. The transducer produces sound waves that travel to your heart and bounce back (or "echo" back) to the transducer. These sound waves will be captured in real-time and changed into images of your heart that can be viewed on a video monitor. The images will be recorded on a computer and reviewed by your health care provider. You may be asked to change positions or hold your breath for a short time. This makes it easier to get different views or better views of your heart. In some cases, you may receive contrast through an IV in one of your veins. This can improve the quality of the pictures from your heart. The procedure may vary among health care providers and hospitals.   What can I expect after the test? You may return to your normal, everyday life, including diet, activities, and medicines, unless your health care provider tells you not to do that. Follow these instructions at home: It is up to you to get the results of your test. Ask your health care provider, or the department that is doing the test, when your results will be ready. Keep all follow-up visits. This is important. Summary An echocardiogram is a test that uses sound waves (ultrasound) to produce images of the heart. Images from an echocardiogram can provide important information about the size and shape of your heart, heart muscle function, heart valve function, and other possible heart problems. You do not need to do anything to prepare before this test. You may eat and drink normally. After the echocardiogram is completed, you may return to your normal, everyday life, unless your health care provider tells you not to do that. This information is not intended to replace advice given to you by your health care provider. Make sure you  discuss any questions you have with your health care provider. Document Revised: 11/27/2019 Document Reviewed: 11/27/2019 Elsevier Patient Education  2021 Elsevier Inc.   Important Information About Sugar        

## 2022-09-02 ENCOUNTER — Ambulatory Visit (HOSPITAL_BASED_OUTPATIENT_CLINIC_OR_DEPARTMENT_OTHER)
Admission: RE | Admit: 2022-09-02 | Discharge: 2022-09-02 | Disposition: A | Discharge status: Home / Self Care | Payer: Medicare Other | Source: Ambulatory Visit | Attending: Cardiology | Admitting: Cardiology

## 2022-09-02 DIAGNOSIS — I351 Nonrheumatic aortic (valve) insufficiency: Secondary | ICD-10-CM

## 2022-09-02 LAB — ECHOCARDIOGRAM COMPLETE
AR max vel: 1.93 cm2
AV Area VTI: 2.02 cm2
AV Area mean vel: 1.85 cm2
AV Mean grad: 8.5 mmHg
AV Peak grad: 14.4 mmHg
Ao pk vel: 1.9 m/s
Area-P 1/2: 3.19 cm2
MV M vel: 6.05 m/s
MV Peak grad: 146.4 mmHg
P 1/2 time: 458 msec
S' Lateral: 2.5 cm

## 2022-09-03 ENCOUNTER — Encounter: Payer: Self-pay | Admitting: Cardiology

## 2022-09-03 ENCOUNTER — Telehealth: Payer: Self-pay | Admitting: Cardiology

## 2022-09-03 NOTE — Telephone Encounter (Signed)
Patient is returning RN's call for her Echo results. Please advise.  

## 2022-09-03 NOTE — Telephone Encounter (Signed)
Error

## 2022-09-03 NOTE — Telephone Encounter (Signed)
Results reviewed with pt as per Dr. Revankar's note.  Pt verbalized understanding and had no additional questions. Routed to PCP.  

## 2022-09-20 DIAGNOSIS — H401131 Primary open-angle glaucoma, bilateral, mild stage: Secondary | ICD-10-CM | POA: Diagnosis not present

## 2022-10-28 DIAGNOSIS — M150 Primary generalized (osteo)arthritis: Secondary | ICD-10-CM | POA: Diagnosis not present

## 2022-10-28 DIAGNOSIS — I10 Essential (primary) hypertension: Secondary | ICD-10-CM | POA: Diagnosis not present

## 2022-10-28 DIAGNOSIS — E7800 Pure hypercholesterolemia, unspecified: Secondary | ICD-10-CM | POA: Diagnosis not present

## 2022-10-28 DIAGNOSIS — E039 Hypothyroidism, unspecified: Secondary | ICD-10-CM | POA: Diagnosis not present

## 2022-11-03 DIAGNOSIS — H40021 Open angle with borderline findings, high risk, right eye: Secondary | ICD-10-CM | POA: Diagnosis not present

## 2022-11-03 DIAGNOSIS — H353223 Exudative age-related macular degeneration, left eye, with inactive scar: Secondary | ICD-10-CM | POA: Diagnosis not present

## 2022-11-03 DIAGNOSIS — H353112 Nonexudative age-related macular degeneration, right eye, intermediate dry stage: Secondary | ICD-10-CM | POA: Diagnosis not present

## 2022-11-03 DIAGNOSIS — H43811 Vitreous degeneration, right eye: Secondary | ICD-10-CM | POA: Diagnosis not present

## 2022-12-27 DIAGNOSIS — L814 Other melanin hyperpigmentation: Secondary | ICD-10-CM | POA: Diagnosis not present

## 2022-12-27 DIAGNOSIS — X32XXXA Exposure to sunlight, initial encounter: Secondary | ICD-10-CM | POA: Diagnosis not present

## 2022-12-27 DIAGNOSIS — Z7189 Other specified counseling: Secondary | ICD-10-CM | POA: Diagnosis not present

## 2022-12-27 DIAGNOSIS — R590 Localized enlarged lymph nodes: Secondary | ICD-10-CM | POA: Diagnosis not present

## 2022-12-27 DIAGNOSIS — L821 Other seborrheic keratosis: Secondary | ICD-10-CM | POA: Diagnosis not present

## 2022-12-27 DIAGNOSIS — L298 Other pruritus: Secondary | ICD-10-CM | POA: Diagnosis not present

## 2022-12-27 DIAGNOSIS — D225 Melanocytic nevi of trunk: Secondary | ICD-10-CM | POA: Diagnosis not present

## 2022-12-28 DIAGNOSIS — H401131 Primary open-angle glaucoma, bilateral, mild stage: Secondary | ICD-10-CM | POA: Diagnosis not present

## 2022-12-28 DIAGNOSIS — Z961 Presence of intraocular lens: Secondary | ICD-10-CM | POA: Diagnosis not present

## 2023-01-05 DIAGNOSIS — R590 Localized enlarged lymph nodes: Secondary | ICD-10-CM | POA: Diagnosis not present

## 2023-01-10 ENCOUNTER — Other Ambulatory Visit: Payer: Self-pay | Admitting: Physician Assistant

## 2023-01-10 DIAGNOSIS — R590 Localized enlarged lymph nodes: Secondary | ICD-10-CM

## 2023-01-17 DIAGNOSIS — R590 Localized enlarged lymph nodes: Secondary | ICD-10-CM | POA: Diagnosis not present

## 2023-04-21 DIAGNOSIS — H401131 Primary open-angle glaucoma, bilateral, mild stage: Secondary | ICD-10-CM | POA: Diagnosis not present

## 2023-04-21 DIAGNOSIS — H5211 Myopia, right eye: Secondary | ICD-10-CM | POA: Diagnosis not present

## 2023-05-11 DIAGNOSIS — H353223 Exudative age-related macular degeneration, left eye, with inactive scar: Secondary | ICD-10-CM | POA: Diagnosis not present

## 2023-05-11 DIAGNOSIS — H40021 Open angle with borderline findings, high risk, right eye: Secondary | ICD-10-CM | POA: Diagnosis not present

## 2023-05-11 DIAGNOSIS — H26493 Other secondary cataract, bilateral: Secondary | ICD-10-CM | POA: Diagnosis not present

## 2023-05-11 DIAGNOSIS — H353112 Nonexudative age-related macular degeneration, right eye, intermediate dry stage: Secondary | ICD-10-CM | POA: Diagnosis not present

## 2023-05-11 DIAGNOSIS — H43811 Vitreous degeneration, right eye: Secondary | ICD-10-CM | POA: Diagnosis not present

## 2023-05-31 DIAGNOSIS — H9190 Unspecified hearing loss, unspecified ear: Secondary | ICD-10-CM | POA: Diagnosis not present

## 2023-05-31 DIAGNOSIS — E7800 Pure hypercholesterolemia, unspecified: Secondary | ICD-10-CM | POA: Diagnosis not present

## 2023-05-31 DIAGNOSIS — Z0000 Encounter for general adult medical examination without abnormal findings: Secondary | ICD-10-CM | POA: Diagnosis not present

## 2023-05-31 DIAGNOSIS — M542 Cervicalgia: Secondary | ICD-10-CM | POA: Diagnosis not present

## 2023-05-31 DIAGNOSIS — M150 Primary generalized (osteo)arthritis: Secondary | ICD-10-CM | POA: Diagnosis not present

## 2023-05-31 DIAGNOSIS — E039 Hypothyroidism, unspecified: Secondary | ICD-10-CM | POA: Diagnosis not present

## 2023-05-31 DIAGNOSIS — F5101 Primary insomnia: Secondary | ICD-10-CM | POA: Diagnosis not present

## 2023-05-31 DIAGNOSIS — I10 Essential (primary) hypertension: Secondary | ICD-10-CM | POA: Diagnosis not present

## 2023-06-01 LAB — LAB REPORT - SCANNED: EGFR: 76

## 2023-07-11 DIAGNOSIS — L821 Other seborrheic keratosis: Secondary | ICD-10-CM | POA: Diagnosis not present

## 2023-07-11 DIAGNOSIS — D225 Melanocytic nevi of trunk: Secondary | ICD-10-CM | POA: Diagnosis not present

## 2023-07-11 DIAGNOSIS — L814 Other melanin hyperpigmentation: Secondary | ICD-10-CM | POA: Diagnosis not present

## 2023-07-11 DIAGNOSIS — L568 Other specified acute skin changes due to ultraviolet radiation: Secondary | ICD-10-CM | POA: Diagnosis not present

## 2023-07-11 DIAGNOSIS — R590 Localized enlarged lymph nodes: Secondary | ICD-10-CM | POA: Diagnosis not present

## 2023-07-11 DIAGNOSIS — X32XXXA Exposure to sunlight, initial encounter: Secondary | ICD-10-CM | POA: Diagnosis not present

## 2023-07-11 DIAGNOSIS — Z7189 Other specified counseling: Secondary | ICD-10-CM | POA: Diagnosis not present

## 2023-07-12 DIAGNOSIS — M5030 Other cervical disc degeneration, unspecified cervical region: Secondary | ICD-10-CM | POA: Diagnosis not present

## 2023-07-12 DIAGNOSIS — M25551 Pain in right hip: Secondary | ICD-10-CM | POA: Diagnosis not present

## 2023-07-21 DIAGNOSIS — M25551 Pain in right hip: Secondary | ICD-10-CM | POA: Diagnosis not present

## 2023-07-21 DIAGNOSIS — M5030 Other cervical disc degeneration, unspecified cervical region: Secondary | ICD-10-CM | POA: Diagnosis not present

## 2023-07-25 DIAGNOSIS — M5030 Other cervical disc degeneration, unspecified cervical region: Secondary | ICD-10-CM | POA: Diagnosis not present

## 2023-07-25 DIAGNOSIS — M25551 Pain in right hip: Secondary | ICD-10-CM | POA: Diagnosis not present

## 2023-08-05 DIAGNOSIS — M5030 Other cervical disc degeneration, unspecified cervical region: Secondary | ICD-10-CM | POA: Diagnosis not present

## 2023-08-05 DIAGNOSIS — M25551 Pain in right hip: Secondary | ICD-10-CM | POA: Diagnosis not present

## 2023-08-09 DIAGNOSIS — M25551 Pain in right hip: Secondary | ICD-10-CM | POA: Diagnosis not present

## 2023-08-09 DIAGNOSIS — M5030 Other cervical disc degeneration, unspecified cervical region: Secondary | ICD-10-CM | POA: Diagnosis not present

## 2023-08-16 DIAGNOSIS — H26493 Other secondary cataract, bilateral: Secondary | ICD-10-CM | POA: Diagnosis not present

## 2023-08-16 DIAGNOSIS — H401131 Primary open-angle glaucoma, bilateral, mild stage: Secondary | ICD-10-CM | POA: Diagnosis not present

## 2023-08-19 DIAGNOSIS — M25551 Pain in right hip: Secondary | ICD-10-CM | POA: Diagnosis not present

## 2023-08-19 DIAGNOSIS — M5030 Other cervical disc degeneration, unspecified cervical region: Secondary | ICD-10-CM | POA: Diagnosis not present

## 2023-08-23 DIAGNOSIS — M5030 Other cervical disc degeneration, unspecified cervical region: Secondary | ICD-10-CM | POA: Diagnosis not present

## 2023-08-23 DIAGNOSIS — M25551 Pain in right hip: Secondary | ICD-10-CM | POA: Diagnosis not present

## 2023-08-24 ENCOUNTER — Encounter: Payer: Self-pay | Admitting: Cardiology

## 2023-08-24 ENCOUNTER — Ambulatory Visit: Attending: Cardiology | Admitting: Cardiology

## 2023-08-24 VITALS — BP 120/60 | HR 88 | Ht 60.0 in | Wt 136.1 lb

## 2023-08-24 DIAGNOSIS — E7800 Pure hypercholesterolemia, unspecified: Secondary | ICD-10-CM | POA: Diagnosis not present

## 2023-08-24 DIAGNOSIS — I351 Nonrheumatic aortic (valve) insufficiency: Secondary | ICD-10-CM | POA: Insufficient documentation

## 2023-08-24 DIAGNOSIS — I10 Essential (primary) hypertension: Secondary | ICD-10-CM | POA: Diagnosis not present

## 2023-08-24 DIAGNOSIS — I700 Atherosclerosis of aorta: Secondary | ICD-10-CM | POA: Diagnosis not present

## 2023-08-24 NOTE — Patient Instructions (Signed)
 Medication Instructions:  Your physician recommends that you continue on your current medications as directed. Please refer to the Current Medication list given to you today.  *If you need a refill on your cardiac medications before your next appointment, please call your pharmacy*  Lab Work: None If you have labs (blood work) drawn today and your tests are completely normal, you will receive your results only by: MyChart Message (if you have MyChart) OR A paper copy in the mail If you have any lab test that is abnormal or we need to change your treatment, we will call you to review the results.  Testing/Procedures: Your physician has requested that you have an echocardiogram. Echocardiography is a painless test that uses sound waves to create images of your heart. It provides your doctor with information about the size and shape of your heart and how well your heart's chambers and valves are working. This procedure takes approximately one hour. There are no restrictions for this procedure. Please do NOT wear cologne, perfume, aftershave, or lotions (deodorant is allowed). Please arrive 15 minutes prior to your appointment time.  Please note: We ask at that you not bring children with you during ultrasound (echo/ vascular) testing. Due to room size and safety concerns, children are not allowed in the ultrasound rooms during exams. Our front office staff cannot provide observation of children in our lobby area while testing is being conducted. An adult accompanying a patient to their appointment will only be allowed in the ultrasound room at the discretion of the ultrasound technician under special circumstances. We apologize for any inconvenience.   Follow-Up: At [facility], you and your health needs are our priority.  As part of our continuing mission to provide you with exceptional heart care, our providers are all part of one team.  This team includes your primary Cardiologist  (physician) and Advanced Practice Providers or APPs (Physician Assistants and Nurse Practitioners) who all work together to provide you with the care you need, when you need it.  Your next appointment:   1 year(s)  Provider:   Hillis Lu, MD    We recommend signing up for the patient portal called "MyChart".  Sign up information is provided on this After Visit Summary.  MyChart is used to connect with patients for Virtual Visits (Telemedicine).  Patients are able to view lab/test results, encounter notes, upcoming appointments, etc.  Non-urgent messages can be sent to your provider as well.   To learn more about what you can do with MyChart, go to [email or web address].   Other Instructions None

## 2023-08-24 NOTE — Progress Notes (Signed)
 Cardiology Office Note:    Date:  08/24/2023   ID:  Patricia [name], DOB 1934/10/29, MRN [id number]  PCP:  Roselind Congo, MD  Cardiologist:  Nelia Balzarine, MD   Referring MD: Roselind Congo, MD    ASSESSMENT:    1. Essential (primary) hypertension   2. Moderate aortic regurgitation   3. Aortic atherosclerosis (HCC)   4. Pure hypercholesterolemia    PLAN:    In order of problems listed above:  Primary prevention stressed with the patient.  Importance of compliance with diet medication stressed and patient verbalized standing. Essential hypertension: Blood pressure is stable and diet was emphasized.  Diet emphasized. Moderate aortic regurgitation: Stable and we will continue to monitor.  She will have an echocardiogram.  She is asymptomatic. Aortic atherosclerosis and mixed dyslipidemia: Diet emphasized.  I discussed statin therapy which she is not keen on it.  I respect her wishes.  Benefits risks explained. Patient will be seen in follow-up appointment in 12 months or earlier if the patient has any concerns.    Medication Adjustments/Labs and Tests Ordered: Current medicines are reviewed at length with the patient today.  Concerns regarding medicines are outlined above.  Orders Placed This Encounter  Procedures   EKG 12-Lead   ECHOCARDIOGRAM COMPLETE   No orders of the defined types were placed in this encounter.    No chief complaint on file.    History of Present Illness:    Patricia [name] is a 89 y.o. female.  Patient has past medical history of essential hypertension, mixed dyslipidemia and moderate aortic regurgitation.  She denies any problems at this time and takes care of activities of daily living.  No chest pain orthopnea or PND.  She ambulates age appropriately.  At the time of my evaluation, the patient is alert awake oriented and in no distress.  Past Medical History:  Diagnosis Date   Abnormal gait 04/24/2021   Age-related macular degeneration, dry,  both eyes 03/08/2016   Allergies    Anemia    Aortic atherosclerosis (HCC) 05/01/2021   Arthritis    Bilateral presbyopia 03/05/2015   Chest pain of uncertain etiology 05/01/2021   Chronic pain of left ankle 04/24/2021   Complication of anesthesia    nausea from fentayl; hypotension episode after previous shoulder surgery   Constipation    Decreased range of motion of ankle 04/24/2021   Degenerative arthritis of right shoulder region 03/06/2011   DJD (degenerative joint disease)    Essential (primary) hypertension    Gastro-esophageal reflux disease without esophagitis 04/24/2021   GERD with esophagitis    Glaucoma    History of bronchitis    History of kidney stones    History of pneumonia    Hyperlipidemia 04/24/2021   Hypertension    Hyperthyroidism    Impaired functional mobility, balance, and endurance 04/24/2021   Iron deficiency anemia    Left rotator cuff tear arthropathy 12/09/2015   Liver nodule    Localized, primary osteoarthritis of shoulder region 05/01/2021   Low back pain    Lower abdominal pain 05/01/2021   Macular degeneration disease    Moderate aortic regurgitation 08/10/2022   Nonexudative age-related macular degeneration 03/05/2015   Normal tension glaucoma of both eyes, mild stage 03/05/2015   Numbness    left foot and toes   Osteoarthritis    Other chest pain    Postoperative anemia due to acute blood loss 03/10/2011   Had pre-existing anemia, now symptomatic with acute on chronic  anemia.   Primary insomnia    Primary osteoarthritis 04/24/2021   Pseudophakia of both eyes 03/05/2015   Pure hypercholesterolemia    Rectal bleeding    Renal insufficiency    Rosacea    Rotator cuff tear, right 03/06/2011   S/P shoulder replacement 12/09/2015   Sleep disorder, unspecified    Spinal stenosis    Spinal stenosis    Spinal stenosis    Stage 3a chronic kidney disease (HCC)    Synovitis of left ankle 04/24/2021    Past Surgical History:  Procedure  Laterality Date   ABDOMINAL HYSTERECTOMY     CARPAL TUNNEL RELEASE     DILATION AND CURETTAGE OF UTERUS     EYE SURGERY Bilateral    cataract   JOINT REPLACEMENT     lt knee-05   KNEE SURGERY     REVERSE SHOULDER ARTHROPLASTY  03/09/2011   Procedure: REVERSE SHOULDER ARTHROPLASTY;  Surgeon: Neville Barbone;  Location: [facility];  Service: Orthopedics;  Laterality: Right;   REVERSE SHOULDER ARTHROPLASTY Left 12/09/2015   Procedure: REVERSE TOTAL SHOULDER ARTHROPLASTY;  Surgeon: Osa Blase, MD;  Location: [facility];  Service: Orthopedics;  Laterality: Left;   ROTATOR CUFF REPAIR, left      Current Medications: Current Meds  Medication Sig   ascorbic acid (VITAMIN C) 500 MG tablet Take 1,500 mg by mouth daily.   cholecalciferol  (VITAMIN D3) 25 MCG (1000 UNIT) tablet Take 2,000 Units by mouth daily.   DANDELION ROOT PO Take 1 capsule by mouth daily.   DOCUSATE SODIUM  PO Take 1-2 capsules by mouth as needed (constipation).   Glucosamine HCl 1000 MG TABS Take 1,000 mg by mouth in the morning and at bedtime.   irbesartan (AVAPRO) 75 MG tablet Take 75 mg by mouth daily.   levothyroxine  (EUTHYROX ) 125 MCG tablet Take 125 mcg by mouth daily.   Magnesium  250 MG TABS Take 250 mg by mouth daily.   Multiple Vitamins-Minerals (MULTIVITAMINS THER. W/MINERALS) TABS Take 1 tablet by mouth daily.     Multiple Vitamins-Minerals (PRESERVISION AREDS) CAPS Take 1 capsule by mouth 2 (two) times daily.     Omega-3 Fatty Acids  (FISH OIL) 1200 MG CAPS Take 1,200 mg by mouth daily.   Probiotic Product (PROBIOTIC PO) Take 1 capsule by mouth daily.   Spirulina 500 MG TABS Take 500 mg by mouth daily.   zinc gluconate 50 MG tablet Take 50 mg by mouth daily.     Allergies:   Dm-guaifenesin er, Doxycycline, Fluticasone, Codeine, Fentanyl  and related, Meclizine, Midazolam , Sulfa antibiotics, and Tramadol   Social History   Socioeconomic History   Marital status: Widowed    Spouse name: Not on  file   Number of children: Not on file   Years of education: Not on file   Highest education level: Not on file  Occupational History   Not on file  Tobacco Use   Smoking status: Never   Smokeless tobacco: Never  Vaping Use   Vaping status: Never Used  Substance and Sexual Activity   Alcohol use: No   Drug use: No   Sexual activity: Not on file  Other Topics Concern   Not on file  Social History Narrative   Not on file   Social Drivers of Health   Financial Resource Strain: Not on file  Food Insecurity: Not on file  Transportation Needs: Not on file  Physical Activity: Not on file  Stress: Not on file  Social Connections: Not on file  Family History: The patient's Family history is unknown by patient.  ROS:   Please see the history of present illness.    All other systems reviewed and are negative.  EKGs/Labs/Other Studies Reviewed:    The following studies were reviewed today: .Aaron AasEKG Interpretation Date/Time:  Wednesday Aug 24 2023 11:05:28 EDT Ventricular Rate:  88 PR Interval:  114 QRS Duration:  82 QT Interval:  376 QTC Calculation: 454 R Axis:   73  Text Interpretation: Sinus rhythm with Premature atrial complexes When compared with ECG of 20-Jan-2022 10:03, Premature atrial complexes are now Present Confirmed by Hillis Lu [id number]) on 08/24/2023 11:17:30 AM     Recent Labs: No results found for requested labs within last 365 days.  Recent Lipid Panel No results found for: "CHOL", "TRIG", "HDL", "CHOLHDL", "VLDL", "LDLCALC", "LDLDIRECT"  Physical Exam:    VS:  BP 120/60   Pulse 88   Ht 5' (1.524 m)   Wt 136 lb 1.3 oz (61.7 kg)   SpO2 94%   BMI 26.58 kg/m     Wt Readings from Last 3 Encounters:  08/24/23 136 lb 1.3 oz (61.7 kg)  08/10/22 131 lb 0.6 oz (59.4 kg)  01/27/22 128 lb (58.1 kg)     GEN: Patient is in no acute distress HEENT: Normal NECK: No JVD; No carotid bruits LYMPHATICS: No lymphadenopathy CARDIAC: Hear sounds  regular, 2/6 systolic murmur at the apex. RESPIRATORY:  Clear to auscultation without rales, wheezing or rhonchi  ABDOMEN: Soft, non-tender, non-distended MUSCULOSKELETAL:  No edema; No deformity  SKIN: Warm and dry NEUROLOGIC:  Alert and oriented x 3 PSYCHIATRIC:  Normal affect   Signed, Nelia Balzarine, MD  08/24/2023 11:32 AM    [facility]

## 2023-09-06 DIAGNOSIS — R1011 Right upper quadrant pain: Secondary | ICD-10-CM | POA: Diagnosis not present

## 2023-09-06 DIAGNOSIS — K625 Hemorrhage of anus and rectum: Secondary | ICD-10-CM | POA: Diagnosis not present

## 2023-09-06 DIAGNOSIS — I10 Essential (primary) hypertension: Secondary | ICD-10-CM | POA: Diagnosis not present

## 2023-09-07 ENCOUNTER — Other Ambulatory Visit: Payer: Self-pay | Admitting: Family Medicine

## 2023-09-07 DIAGNOSIS — R1011 Right upper quadrant pain: Secondary | ICD-10-CM

## 2023-09-07 DIAGNOSIS — D649 Anemia, unspecified: Secondary | ICD-10-CM

## 2023-09-08 ENCOUNTER — Encounter: Payer: Self-pay | Admitting: Family Medicine

## 2023-09-08 ENCOUNTER — Ambulatory Visit
Admission: RE | Admit: 2023-09-08 | Discharge: 2023-09-08 | Disposition: A | Discharge status: Home / Self Care | Source: Ambulatory Visit | Attending: Family Medicine | Admitting: Family Medicine

## 2023-09-08 DIAGNOSIS — D649 Anemia, unspecified: Secondary | ICD-10-CM

## 2023-09-08 DIAGNOSIS — R1010 Upper abdominal pain, unspecified: Secondary | ICD-10-CM | POA: Diagnosis not present

## 2023-09-08 DIAGNOSIS — R1011 Right upper quadrant pain: Secondary | ICD-10-CM

## 2023-09-08 MED ORDER — IOPAMIDOL (ISOVUE-370) INJECTION 76%
70.0000 mL | Freq: Once | INTRAVENOUS | Status: AC | PRN
  Administered 2023-09-08: 70 mL via INTRAVENOUS

## 2023-09-14 ENCOUNTER — Other Ambulatory Visit: Payer: Self-pay | Admitting: Cardiology

## 2023-09-14 DIAGNOSIS — I10 Essential (primary) hypertension: Secondary | ICD-10-CM

## 2023-09-14 DIAGNOSIS — E7800 Pure hypercholesterolemia, unspecified: Secondary | ICD-10-CM

## 2023-09-14 DIAGNOSIS — I351 Nonrheumatic aortic (valve) insufficiency: Secondary | ICD-10-CM

## 2023-09-14 DIAGNOSIS — I700 Atherosclerosis of aorta: Secondary | ICD-10-CM

## 2023-09-20 ENCOUNTER — Ambulatory Visit (HOSPITAL_BASED_OUTPATIENT_CLINIC_OR_DEPARTMENT_OTHER)

## 2023-09-22 ENCOUNTER — Ambulatory Visit (HOSPITAL_BASED_OUTPATIENT_CLINIC_OR_DEPARTMENT_OTHER)
Admission: RE | Admit: 2023-09-22 | Discharge: 2023-09-22 | Disposition: A | Discharge status: Home / Self Care | Source: Ambulatory Visit | Attending: Cardiology | Admitting: Cardiology

## 2023-09-22 DIAGNOSIS — I351 Nonrheumatic aortic (valve) insufficiency: Secondary | ICD-10-CM | POA: Diagnosis not present

## 2023-09-22 LAB — ECHOCARDIOGRAM COMPLETE
AR max vel: 1.7 cm2
AV Area VTI: 1.76 cm2
AV Area mean vel: 1.63 cm2
AV Mean grad: 8.5 mmHg
AV Peak grad: 12.8 mmHg
AV Vena cont: 0.2 cm
Ao pk vel: 1.79 m/s
Area-P 1/2: 2.66 cm2
Calc EF: 74.9 %
MV M vel: 5.21 m/s
MV Peak grad: 108.6 mmHg
P 1/2 time: 676 ms
S' Lateral: 1.7 cm
Single Plane A2C EF: 74.1 %
Single Plane A4C EF: 75.9 %

## 2023-09-23 ENCOUNTER — Ambulatory Visit: Payer: Self-pay | Admitting: Cardiology

## 2023-09-29 DIAGNOSIS — K5900 Constipation, unspecified: Secondary | ICD-10-CM | POA: Diagnosis not present

## 2023-09-29 DIAGNOSIS — D649 Anemia, unspecified: Secondary | ICD-10-CM | POA: Diagnosis not present

## 2023-09-29 DIAGNOSIS — K921 Melena: Secondary | ICD-10-CM | POA: Diagnosis not present

## 2023-10-26 DIAGNOSIS — K921 Melena: Secondary | ICD-10-CM | POA: Diagnosis not present

## 2023-10-26 DIAGNOSIS — K5900 Constipation, unspecified: Secondary | ICD-10-CM | POA: Diagnosis not present

## 2023-11-08 DIAGNOSIS — H26493 Other secondary cataract, bilateral: Secondary | ICD-10-CM | POA: Diagnosis not present

## 2023-11-08 DIAGNOSIS — H353223 Exudative age-related macular degeneration, left eye, with inactive scar: Secondary | ICD-10-CM | POA: Diagnosis not present

## 2023-11-08 DIAGNOSIS — H353112 Nonexudative age-related macular degeneration, right eye, intermediate dry stage: Secondary | ICD-10-CM | POA: Diagnosis not present

## 2023-11-08 DIAGNOSIS — H40021 Open angle with borderline findings, high risk, right eye: Secondary | ICD-10-CM | POA: Diagnosis not present

## 2023-11-08 DIAGNOSIS — H43813 Vitreous degeneration, bilateral: Secondary | ICD-10-CM | POA: Diagnosis not present

## 2023-12-12 DIAGNOSIS — K921 Melena: Secondary | ICD-10-CM | POA: Diagnosis not present

## 2023-12-12 DIAGNOSIS — K5900 Constipation, unspecified: Secondary | ICD-10-CM | POA: Diagnosis not present

## 2023-12-15 DIAGNOSIS — Z961 Presence of intraocular lens: Secondary | ICD-10-CM | POA: Diagnosis not present

## 2023-12-15 DIAGNOSIS — H401131 Primary open-angle glaucoma, bilateral, mild stage: Secondary | ICD-10-CM | POA: Diagnosis not present

## 2023-12-22 DIAGNOSIS — R109 Unspecified abdominal pain: Secondary | ICD-10-CM | POA: Diagnosis not present

## 2024-02-15 DIAGNOSIS — G8929 Other chronic pain: Secondary | ICD-10-CM | POA: Diagnosis not present

## 2024-02-15 DIAGNOSIS — M1611 Unilateral primary osteoarthritis, right hip: Secondary | ICD-10-CM | POA: Diagnosis not present

## 2024-02-15 DIAGNOSIS — M1711 Unilateral primary osteoarthritis, right knee: Secondary | ICD-10-CM | POA: Diagnosis not present

## 2024-03-05 ENCOUNTER — Encounter: Payer: Self-pay | Admitting: Cardiology

## 2024-03-12 ENCOUNTER — Telehealth: Payer: Self-pay

## 2024-03-12 DIAGNOSIS — H353221 Exudative age-related macular degeneration, left eye, with active choroidal neovascularization: Secondary | ICD-10-CM | POA: Diagnosis not present

## 2024-03-12 DIAGNOSIS — H401131 Primary open-angle glaucoma, bilateral, mild stage: Secondary | ICD-10-CM | POA: Diagnosis not present

## 2024-03-12 NOTE — Telephone Encounter (Signed)
   Name: Patricia [name]  DOB: 12-11-1934  MRN: [id number]  Primary Cardiologist: None   Preoperative team, please contact this patient and set up a phone call appointment for further preoperative risk assessment. Please obtain consent and complete medication review. Thank you for your help.  I confirm that guidance regarding antiplatelet and oral anticoagulation therapy has been completed and, if necessary, noted below.  None requested  I also confirmed the patient resides in the state of [place] . As per [place] [facility] telemedicine laws, the patient must reside in the state in which the provider is licensed.   Lum LITTIE Louis, NP 03/12/2024, 9:28 AM [facility]

## 2024-03-12 NOTE — Telephone Encounter (Signed)
   Pre-operative Risk Assessment    Patient Name: Patricia [name]  DOB: 09-Jun-1934 MRN: [id number]   Date of last office visit: 08/24/23 Date of next office visit: N/A   Request for Surgical Clearance    Procedure:  total hip arthroplasty  Date of Surgery:  Clearance TBD                                Surgeon:  Dr. Fonda Olmsted Surgeon's Group or Practice Name:  [facility] Phone number:  [phone] Fax number:  [phone]   Type of Clearance Requested:   - Medical    Type of Anesthesia:  Spinal   Additional requests/questions:    SignedAnnabella LITTIE Sayres   03/12/2024, 9:01 AM

## 2024-03-12 NOTE — Telephone Encounter (Signed)
 Called and spoke to the patient and her daughter, (she is [place]), patient is scheduled for pre-op clearance on 03/21/24, with Damien Braver, NP. Med list was reviewed and updated. Consent was obtained as well.       Patient Consent for Virtual Visit        Patricia [name] has provided verbal consent on 03/12/2024 for a virtual visit (video or telephone).   CONSENT FOR VIRTUAL VISIT FOR:  Patricia [name]  By participating in this virtual visit I agree to the following:  I hereby voluntarily request, consent and authorize [facility] and its employed or contracted physicians, physician assistants, nurse practitioners or other licensed health care professionals (the Practitioner), to provide me with telemedicine health care services (the "Services) as deemed necessary by the treating Practitioner. I acknowledge and consent to receive the Services by the Practitioner via telemedicine. I understand that the telemedicine visit will involve communicating with the Practitioner through live audiovisual communication technology and the disclosure of certain medical information by electronic transmission. I acknowledge that I have been given the opportunity to request an in-person assessment or other available alternative prior to the telemedicine visit and am voluntarily participating in the telemedicine visit.  I understand that I have the right to withhold or withdraw my consent to the use of telemedicine in the course of my care at any time, without affecting my right to future care or treatment, and that the Practitioner or I may terminate the telemedicine visit at any time. I understand that I have the right to inspect all information obtained and/or recorded in the course of the telemedicine visit and may receive copies of available information for a reasonable fee.  I understand that some of the potential risks of receiving the Services via telemedicine include:  Delay or interruption in medical  evaluation due to technological equipment failure or disruption; Information transmitted may not be sufficient (e.g. poor resolution of images) to allow for appropriate medical decision making by the Practitioner; and/or  In rare instances, security protocols could fail, causing a breach of personal health information.  Furthermore, I acknowledge that it is my responsibility to provide information about my medical history, conditions and care that is complete and accurate to the best of my ability. I acknowledge that Practitioner's advice, recommendations, and/or decision may be based on factors not within their control, such as incomplete or inaccurate data provided by me or distortions of diagnostic images or specimens that may result from electronic transmissions. I understand that the practice of medicine is not an exact science and that Practitioner makes no warranties or guarantees regarding treatment outcomes. I acknowledge that a copy of this consent can be made available to me via my patient portal [facility] MyChart), or I can request a printed copy by calling the office of [facility].    I understand that my insurance will be billed for this visit.   I have read or had this consent read to me. I understand the contents of this consent, which adequately explains the benefits and risks of the Services being provided via telemedicine.  I have been provided ample opportunity to ask questions regarding this consent and the Services and have had my questions answered to my satisfaction. I give my informed consent for the services to be provided through the use of telemedicine in my medical care

## 2024-03-21 ENCOUNTER — Ambulatory Visit: Attending: Cardiology | Admitting: Nurse Practitioner

## 2024-03-21 DIAGNOSIS — Z01810 Encounter for preprocedural cardiovascular examination: Secondary | ICD-10-CM | POA: Insufficient documentation

## 2024-03-21 NOTE — Progress Notes (Signed)
 Virtual Visit via Telephone Note   Because of Patricia [name] co-morbid illnesses, she is at least at moderate risk for complications without adequate follow up.  This format is felt to be most appropriate for this patient at this time.  Due to technical limitations with video connection (technology), today's appointment will be conducted as an audio only telehealth visit, and [name] verbally agreed to proceed in this manner.   All issues noted in this document were discussed and addressed.  No physical exam could be performed with this format.  Evaluation Performed:  Preoperative cardiovascular risk assessment _____________   Date:  03/21/2024   Patient ID:  Patricia [name], DOB [birth year], MRN [id number] Patient Location:  Home Provider location:   Office  Primary Care Provider:  Arloa Elsie SAUNDERS, MD Primary Cardiologist:  None  Chief Complaint / Patient Profile   89 y.o. y/o female with a h/o aortic valve regurgitation, aortic atherosclerosis, hypertension, and hyperlipidemia who is pending total hip arthroplasty (pt states that she is having R knee surgery first) with Dr. Fonda Olmsted of [facility] and presents today for telephonic preoperative cardiovascular risk assessment.  History of Present Illness    [name] is a 89 y.o. female who presents via audio/video conferencing for a telehealth visit today.  Pt was last seen in cardiology clinic on 08/24/2023 by to Revankar.  At that time Gabrella Stroh was doing well.  The patient is now pending procedure as outlined above. Since her last visit, she has done well from a cardiac standpoint.   She denies chest pain, palpitations, dyspnea, pnd, orthopnea, n, v, dizziness, syncope, edema, weight gain, or early satiety. All other systems reviewed and are otherwise negative except as noted above.   Past Medical History    Past Medical History:  Diagnosis Date   Abnormal gait 04/24/2021   Age-related macular degeneration, dry, both eyes  03/08/2016   Allergies    Anemia    Aortic atherosclerosis 05/01/2021   Arthritis    Bilateral presbyopia 03/05/2015   Chest pain of uncertain etiology 05/01/2021   Chronic pain of left ankle 04/24/2021   Complication of anesthesia    nausea from fentayl; hypotension episode after previous shoulder surgery   Constipation    Decreased range of motion of ankle 04/24/2021   Degenerative arthritis of right shoulder region 03/06/2011   DJD (degenerative joint disease)    Essential (primary) hypertension    Gastro-esophageal reflux disease without esophagitis 04/24/2021   GERD with esophagitis    Glaucoma    History of bronchitis    History of kidney stones    History of pneumonia    Hyperlipidemia 04/24/2021   Hypertension    Hyperthyroidism    Impaired functional mobility, balance, and endurance 04/24/2021   Iron deficiency anemia    Left rotator cuff tear arthropathy 12/09/2015   Liver nodule    Localized, primary osteoarthritis of shoulder region 05/01/2021   Low back pain    Lower abdominal pain 05/01/2021   Macular degeneration disease    Moderate aortic regurgitation 08/10/2022   Nonexudative age-related macular degeneration 03/05/2015   Normal tension glaucoma of both eyes, mild stage 03/05/2015   Numbness    left foot and toes   Osteoarthritis    Other chest pain    Postoperative anemia due to acute blood loss 03/10/2011   Had pre-existing anemia, now symptomatic with acute on chronic anemia.   Primary insomnia    Primary osteoarthritis 04/24/2021   Pseudophakia of both  eyes 03/05/2015   Pure hypercholesterolemia    Rectal bleeding    Renal insufficiency    Rosacea    Rotator cuff tear, right 03/06/2011   S/P shoulder replacement 12/09/2015   Sleep disorder, unspecified    Spinal stenosis    Spinal stenosis    Spinal stenosis    Stage 3a chronic kidney disease (HCC)    Synovitis of left ankle 04/24/2021   Past Surgical History:  Procedure Laterality Date    ABDOMINAL HYSTERECTOMY     CARPAL TUNNEL RELEASE     DILATION AND CURETTAGE OF UTERUS     EYE SURGERY Bilateral    cataract   JOINT REPLACEMENT     lt knee-05   KNEE SURGERY     REVERSE SHOULDER ARTHROPLASTY  03/09/2011   Procedure: REVERSE SHOULDER ARTHROPLASTY;  Surgeon: Fonda SHAUNNA Olmsted;  Location: [facility];  Service: Orthopedics;  Laterality: Right;   REVERSE SHOULDER ARTHROPLASTY Left 12/09/2015   Procedure: REVERSE TOTAL SHOULDER ARTHROPLASTY;  Surgeon: Fonda Olmsted, MD;  Location: [facility];  Service: Orthopedics;  Laterality: Left;   ROTATOR CUFF REPAIR, left      Allergies  Allergies  Allergen Reactions   Dm-Guaifenesin Er     dizziness, tremor   Doxycycline     headaches and nervous jitters   Fluticasone      nosebleeds   Codeine Nausea And Vomiting   Fentanyl  And Related Nausea And Vomiting   Meclizine Nausea And Vomiting   Midazolam      Adverse reaction to substance, nausea   Sulfa Antibiotics Nausea And Vomiting and Rash   Tramadol Nausea And Vomiting    Home Medications    Prior to Admission medications   Medication Sig Start Date End Date Taking? Authorizing Provider  ascorbic acid (VITAMIN C) 500 MG tablet Take 1,500 mg by mouth daily.    [provider]  cholecalciferol  (VITAMIN D3) 25 MCG (1000 UNIT) tablet Take 2,000 Units by mouth daily.    [provider]  DANDELION ROOT PO Take 1 capsule by mouth daily.    [provider]  DOCUSATE SODIUM  PO Take 1-2 capsules by mouth as needed (constipation).    [provider]  Glucosamine HCl 1000 MG TABS Take 1,000 mg by mouth in the morning and at bedtime.    [provider]  irbesartan (AVAPRO) 75 MG tablet Take 75 mg by mouth daily. 01/20/22   [provider]  levothyroxine  (EUTHYROX ) 125 MCG tablet Take 125 mcg by mouth daily. 03/06/20   [provider]  LINZESS 145 MCG CAPS capsule Take 145 mcg by mouth daily.    [provider]  Magnesium  250 MG TABS Take 250 mg by mouth daily.    [provider]  Multiple Vitamins-Minerals (MULTIVITAMINS THER. W/MINERALS) TABS Take 1 tablet by mouth daily.      [provider]  Multiple Vitamins-Minerals (PRESERVISION AREDS) CAPS Take 1 capsule by mouth 2 (two) times daily.      [provider]  Omega-3 Fatty Acids  (FISH OIL) 1200 MG CAPS Take 1,200 mg by mouth daily.    [provider]  Probiotic Product (PROBIOTIC PO) Take 1 capsule by mouth daily.    [provider]  Spirulina 500 MG TABS Take 500 mg by mouth daily.    [provider]  zinc gluconate 50 MG tablet Take 50 mg by mouth daily.    [provider]    Physical Exam    Vital  Signs:  Maryclaire Stoecker does not have vital signs available for review today.  Given telephonic nature of communication, physical exam is limited. AAOx3. NAD. Normal affect.  Speech and respirations are unlabored.  Accessory Clinical Findings    None  Assessment & Plan    1.  Preoperative Cardiovascular Risk Assessment:  According to the Revised Cardiac Risk Index (RCRI), her Perioperative Risk of Major Cardiac Event is (%): 0.4. Her Functional Capacity in METs is: 5.29 according to the Duke Activity Status Index (DASI). Therefore, based on ACC/AHA guidelines, patient would be at acceptable risk for the planned procedure without further cardiovascular testing. I will route this recommendation to the requesting party via Epic fax function.  The patient was advised that if she develops new symptoms prior to surgery to contact our office to arrange for a follow-up visit, and she verbalized understanding.  A copy of this note will be routed to requesting surgeon.  Time:   Today, I have spent 6 minutes with the patient with telehealth technology discussing medical history, symptoms, and management plan.     Damien JAYSON Braver, NP  03/21/2024, 12:51 PM

## 2024-05-17 NOTE — Care Plan (Signed)
 Ortho Bundle Case Management Note  Patient Details  Name: Patricia [name] MRN: [id number] Date of Birth: February 19, 1935  met with patient and daughter in the office for H&P. will discharge to home with family to assist. has DME. OPPT set up with [employer]. discharge instructions discussed and questions answered. Patient and MD in agreement with plan. Choice offered.                    DME Arranged:    DME Agency:     HH Arranged:    HH Agency:     Additional Comments: Please contact me with any questions of if this plan should need to change.  Charlies Pitch,  RN,BSN,MHA,CCM  [facility] Orthopaedic Specialist  [phone] 05/17/2024, 2:22 PM

## 2024-05-18 NOTE — H&P (Cosign Needed)
 KNEE ARTHROPLASTY ADMISSION H&P  Patient ID: Patricia [name] MRN: [id number] DOB/AGE: 06-03-1934 89 y.o.  Chief Complaint: right knee pain.  Planned Procedure Date: 05/29/24 Medical Clearance by Dr. Arloa   Cardiac Clearance by Damien Braver, NP  HPI: Patricia [name] is a 89 y.o. female who presents for evaluation of Primary osteoarthritis of right knee. The patient has a history of pain and functional disability in the right knee due to arthritis and has failed non-surgical conservative treatments for greater than 12 weeks to include NSAID's and/or analgesics, corticosteriod injections, viscosupplementation injections, and activity modification.  Onset of symptoms was gradual, starting 10 years ago with gradually worsening course since that time. The patient noted no past surgery on the right knee.  Patient currently rates pain at 10 out of 10 with activity if she doesn't keep her leg completely straight all the time. Patient has worsening of pain with activity and weight bearing and pain that interferes with activities of daily living.  Patient has evidence of joint space narrowing by imaging studies.  There is no active infection.  Past Medical History:  Diagnosis Date   Abnormal gait 04/24/2021   Age-related macular degeneration, dry, both eyes 03/08/2016   Allergies    Anemia    Aortic atherosclerosis 05/01/2021   Arthritis    Bilateral presbyopia 03/05/2015   Chest pain of uncertain etiology 05/01/2021   Chronic pain of left ankle 04/24/2021   Complication of anesthesia    nausea from fentayl; hypotension episode after previous shoulder surgery   Constipation    Decreased range of motion of ankle 04/24/2021   Degenerative arthritis of right shoulder region 03/06/2011   DJD (degenerative joint disease)    Essential (primary) hypertension    Gastro-esophageal reflux disease without esophagitis 04/24/2021   GERD with esophagitis    Glaucoma    History of bronchitis    History of kidney  stones    History of pneumonia    Hyperlipidemia 04/24/2021   Hypertension    Hyperthyroidism    Impaired functional mobility, balance, and endurance 04/24/2021   Iron deficiency anemia    Left rotator cuff tear arthropathy 12/09/2015   Liver nodule    Localized, primary osteoarthritis of shoulder region 05/01/2021   Low back pain    Lower abdominal pain 05/01/2021   Macular degeneration disease    Moderate aortic regurgitation 08/10/2022   Nonexudative age-related macular degeneration 03/05/2015   Normal tension glaucoma of both eyes, mild stage 03/05/2015   Numbness    left foot and toes   Osteoarthritis    Other chest pain    Postoperative anemia due to acute blood loss 03/10/2011   Had pre-existing anemia, now symptomatic with acute on chronic anemia.   Primary insomnia    Primary osteoarthritis 04/24/2021   Pseudophakia of both eyes 03/05/2015   Pure hypercholesterolemia    Rectal bleeding    Renal insufficiency    Rosacea    Rotator cuff tear, right 03/06/2011   S/P shoulder replacement 12/09/2015   Sleep disorder, unspecified    Spinal stenosis    Spinal stenosis    Spinal stenosis    Stage 3a chronic kidney disease (HCC)    Synovitis of left ankle 04/24/2021   Past Surgical History:  Procedure Laterality Date   ABDOMINAL HYSTERECTOMY     CARPAL TUNNEL RELEASE     DILATION AND CURETTAGE OF UTERUS     EYE SURGERY Bilateral    cataract   JOINT REPLACEMENT  lt knee-05   KNEE SURGERY     REVERSE SHOULDER ARTHROPLASTY  03/09/2011   Procedure: REVERSE SHOULDER ARTHROPLASTY;  Surgeon: Fonda SHAUNNA Olmsted;  Location: [facility];  Service: Orthopedics;  Laterality: Right;   REVERSE SHOULDER ARTHROPLASTY Left 12/09/2015   Procedure: REVERSE TOTAL SHOULDER ARTHROPLASTY;  Surgeon: Fonda Olmsted, MD;  Location: [facility] OR;  Service: Orthopedics;  Laterality: Left;   ROTATOR CUFF REPAIR, left     Allergies[1] Prior to Admission medications  Medication Sig  Start Date End Date Taking? Authorizing Provider  ascorbic acid (VITAMIN C) 1000 MG tablet Take 1,000 mg by mouth 2 (two) times daily.   Yes [provider]  Docusate Sodium  100 MG capsule Take 100 mg by mouth daily as needed (constipation).   Yes [provider]  GLUTATHIONE PO Take 350 mg by mouth daily.   Yes [provider]  irbesartan (AVAPRO) 75 MG tablet Take 75 mg by mouth daily. 01/20/22  Yes [provider]  KRILL OIL PO Take 1,000 mg by mouth daily. krill oil with 2mg  astaxanthin   Yes [provider]  levothyroxine  (EUTHYROX ) 125 MCG tablet Take 125 mcg by mouth daily. 03/06/20  Yes [provider]  LINZESS 145 MCG CAPS capsule Take 145 mcg by mouth daily.   Yes [provider]  MAGNESIUM  PO Take 145 mg by mouth 2 (two) times daily.   Yes [provider]  Multiple Vitamins-Minerals (PRESERVISION AREDS) CAPS Take 1 capsule by mouth 2 (two) times daily.     Yes [provider]  OVER THE COUNTER MEDICATION Take 500 mg by mouth daily. Curcurmin   Yes [provider]  OVER THE COUNTER MEDICATION Take 1 tablet by mouth 2 (two) times daily. Joot   Yes [provider]  Probiotic Product (PROBIOTIC PO) Take 1 capsule by mouth daily. antioxidants   Yes [provider]  Vitamin D -Vitamin K (VITAMIN K2-VITAMIN D3 PO) Take 1 tablet by mouth daily. 5000 units/120 mcg   Yes [provider]  zinc gluconate 50 MG tablet Take 50 mg by mouth daily.   Yes [provider]  clotrimazole-betamethasone  (LOTRISONE) cream Apply topically 2 (two) times daily.    [provider]   Social History   Socioeconomic History   Marital status: Widowed    Spouse name: Not on file   Number of children: Not on file   Years of education: Not on file   Highest education level: Not on file  Occupational History   Not on file  Tobacco Use   Smoking status: Never   Smokeless tobacco:  Never  Vaping Use   Vaping status: Never Used  Substance and Sexual Activity   Alcohol use: No   Drug use: No   Sexual activity: Not on file  Other Topics Concern   Not on file  Social History Narrative   Not on file   Social Drivers of Health   Tobacco Use: Low Risk (03/21/2024)   Patient History    Smoking Tobacco Use: Never    Smokeless Tobacco Use: Never    Passive Exposure: Not on file  Financial Resource Strain: Not on file  Food Insecurity: Not on file  Transportation Needs: Not on file  Physical Activity: Not on file  Stress: Not on file  Social Connections: Not on file  Depression (EYV7-0): Not on file  Alcohol Screen: Not on file  Housing: Not on file  Utilities: Not on file  Health Literacy: Not on  file   Family History  Family history unknown: Yes    ROS: Currently denies lightheadedness, dizziness, Fever, chills, CP, SOB. No personal history of DVT, PE, MI, or CVA. No loose teeth or dentures All other systems have been reviewed and were otherwise currently negative with the exception of those mentioned in the HPI and as above.  Objective: General: Alert, no acute distress Cardiovascular: No pedal edema Respiratory: No cyanosis, no use of accessory musculature GI: No organomegaly, abdomen is soft and non-tender Skin: No lesions in the area of chief complaint Neurologic: Sensation intact distally Psychiatric: Patient is competent for consent with normal mood and affect Lymphatic: No axillary or cervical lymphadenopathy MSK/Surgical Site: right knee w/o redness or effusion.  no JLT. Varus alignment +EHL/FHL.  NVI.     Imaging Review Plain radiographs demonstrate severe degenerative joint disease of the right knee.   Preoperative templating of the joint replacement has been completed, documented, and submitted to the Operating Room personnel in order to optimize intra-operative equipment management.  Assessment: Primary osteoarthritis of right  knee  Plan: Plan for Procedures: ARTHROPLASTY, KNEE, TOTAL  The patient history, physical exam, clinical judgement of the provider and imaging are consistent with end stage degenerative joint disease and total joint arthroplasty is deemed medically necessary. The treatment options including medical management, injection therapy, and arthroplasty were discussed at length. The risks and benefits of Procedures: ARTHROPLASTY, KNEE, TOTAL were presented and reviewed.  The risks of nonoperative treatment, versus surgical intervention including but not limited to continued pain, aseptic loosening, stiffness, dislocation/subluxation, infection, bleeding, nerve injury, blood clots, cardiopulmonary complications, morbidity, mortality, among others were discussed. The patient verbalizes understanding and wishes to proceed with the plan.  Patient is being admitted for inpatient treatment for surgery, pain control, PT, prophylactic antibiotics, VTE prophylaxis, progressive ambulation, ADL's and discharge planning.   The patient does meet the criteria for TXA which will be used perioperatively.   ASA 325 mg will be used postoperatively for DVT prophylaxis in addition to SCDs, and early ambulation. The patient is planning to be discharged home in care of daughter, please see Care Plan note regarding PT plan.   Army MARLA Daring, PA-C 05/18/2024 2:24 PM    [1]  Allergies Allergen Reactions   Dm-Guaifenesin Er     dizziness, tremor   Doxycycline     headaches and nervous jitters   Fluticasone      nosebleeds   Codeine Nausea And Vomiting   Fentanyl  And Related Nausea And Vomiting   Meclizine Nausea And Vomiting   Midazolam      Adverse reaction to substance, nausea   Sulfa Antibiotics Nausea And Vomiting and Rash   Tramadol Nausea And Vomiting

## 2024-05-21 NOTE — Patient Instructions (Signed)
 SURGICAL WAITING ROOM VISITATION  Patients having surgery or a procedure may have no more than 2 support people in the waiting area - these visitors may rotate.    Children ages 61 and under will not be able to visit patients in [facility] under most circumstances.   Visitors with respiratory illnesses are discouraged from visiting and should remain at home.  If the patient needs to stay at the hospital during part of their recovery, the visitor guidelines for inpatient rooms apply. Pre-op nurse will coordinate an appropriate time for 1 support person to accompany patient in pre-op.  This support person may not rotate.    Please refer to the [facility] website for the visitor guidelines for Inpatients (after your surgery is over and you are in a regular room).       Your procedure is scheduled on:   05/29/2024    Report to [facility] Main Entrance    Report to admitting at   0930AM   Call this number if you have problems the morning of surgery [phone]   Do not eat food :After Midnight.   After Midnight you may have the following liquids until _ 0900_____ AM DAY OF SURGERY  Water Non-Citrus Juices (without pulp, NO RED-Apple, White grape, White cranberry) Black Coffee (NO MILK/CREAM OR CREAMERS, sugar ok)  Clear Tea (NO MILK/CREAM OR CREAMERS, sugar ok) regular and decaf                             Plain Jell-O (NO RED)                                           Fruit ices (not with fruit pulp, NO RED)                                     Popsicles (NO RED)                                                               Sports drinks like Gatorade (NO RED)                  The day of surgery:  Drink ONE (1) Pre-Surgery Clear Ensure or G2 at  0900 AM the morning of surgery. Drink in one sitting. Do not sip.  This drink was given to you during your hospital  pre-op appointment visit. Nothing else to drink after completing the  Pre-Surgery Clear Ensure or  G2.          If you have questions, please contact your surgeons office.       Oral Hygiene is also important to reduce your risk of infection.                                    Remember - BRUSH YOUR TEETH THE MORNING OF SURGERY WITH YOUR REGULAR TOOTHPASTE  DENTURES WILL BE REMOVED PRIOR TO SURGERY PLEASE DO NOT APPLY Poly  grip OR ADHESIVES!!!   Do NOT smoke after Midnight   Stop all vitamins and herbal supplements 7 days before surgery.   Take these medicines the morning of surgery with A SIP OF WATER:  levothyroxine , magnesium    DO NOT TAKE ANY ORAL DIABETIC MEDICATIONS DAY OF YOUR SURGERY  Bring CPAP mask and tubing day of surgery.                              You may not have any metal on your body including hair pins, jewelry, and body piercing             Do not wear make-up, lotions, powders, perfumes/cologne, or deodorant  Do not wear nail polish including gel and S&S, artificial/acrylic nails, or any other type of covering on natural nails including finger and toenails. If you have artificial nails, gel coating, etc. that needs to be removed by a nail salon please have this removed prior to surgery or surgery may need to be canceled/ delayed if the surgeon/ anesthesia feels like they are unable to be safely monitored.   Do not shave  48 hours prior to surgery.               Men may shave face and neck.   Do not bring valuables to the hospital. [facility] IS NOT             RESPONSIBLE   FOR VALUABLES.   Contacts, glasses, dentures or bridgework may not be worn into surgery.   Bring small overnight bag day of surgery.   DO NOT BRING YOUR HOME MEDICATIONS TO THE HOSPITAL. PHARMACY WILL DISPENSE MEDICATIONS LISTED ON YOUR MEDICATION LIST TO YOU DURING YOUR ADMISSION IN THE HOSPITAL!    Patients discharged on the day of surgery will not be allowed to drive home.  Someone NEEDS to stay with you for the first 24 hours after anesthesia.   Special Instructions:  Bring a copy of your healthcare power of attorney and living will documents the day of surgery if you haven't scanned them before.              Please read over the following fact sheets you were given: IF YOU HAVE QUESTIONS ABOUT YOUR PRE-OP INSTRUCTIONS PLEASE CALL [phone].   If you received a COVID test during your pre-op visit  it is requested that you wear a mask when out in public, stay away from anyone that may not be feeling well and notify your surgeon if you develop symptoms. If you test positive for Covid or have been in contact with anyone that has tested positive in the last 10 days please notify you surgeon.      Pre-operative 4 CHG Bath Instructions   You can play a key role in reducing the risk of infection after surgery. Your skin needs to be as free of germs as possible. You can reduce the number of germs on your skin by washing with CHG (chlorhexidine  gluconate) soap before surgery. CHG is an antiseptic soap that kills germs and continues to kill germs even after washing.   DO NOT use if you have an allergy to chlorhexidine /CHG or antibacterial soaps. If your skin becomes reddened or irritated, stop using the CHG and notify one of our RNs at [phone].   Please shower with the CHG soap starting 4 days before surgery using the following schedule:     Please keep in  mind the following:  DO NOT shave, including legs and underarms, starting the day of your first shower.   You may shave your face at any point before/day of surgery.  Place clean sheets on your bed the day you start using CHG soap. Use a clean washcloth (not used since being washed) for each shower. DO NOT sleep with pets once you start using the CHG.   CHG Shower Instructions:  If you choose to wash your hair and private area, wash first with your normal shampoo/soap.  After you use shampoo/soap, rinse your hair and body thoroughly to remove shampoo/soap residue.  Turn the water OFF and apply about 3  tablespoons (45 ml) of CHG soap to a CLEAN washcloth.  Apply CHG soap ONLY FROM YOUR NECK DOWN TO YOUR TOES (washing for 3-5 minutes)  DO NOT use CHG soap on face, private areas, open wounds, or sores.  Pay special attention to the area where your surgery is being performed.  If you are having back surgery, having someone wash your back for you may be helpful. Wait 2 minutes after CHG soap is applied, then you may rinse off the CHG soap.  Pat dry with a clean towel  Put on clean clothes/pajamas   If you choose to wear lotion, please use ONLY the CHG-compatible lotions on the back of this paper.     Additional instructions for the day of surgery: DO NOT APPLY any lotions, deodorants, cologne, or perfumes.   Put on clean/comfortable clothes.  Brush your teeth.  Ask your nurse before applying any prescription medications to the skin.      CHG Compatible Lotions   Aveeno Moisturizing lotion  Cetaphil Moisturizing Cream  Cetaphil Moisturizing Lotion  Clairol Herbal Essence Moisturizing Lotion, Dry Skin  Clairol Herbal Essence Moisturizing Lotion, Extra Dry Skin  Clairol Herbal Essence Moisturizing Lotion, Normal Skin  Curel Age Defying Therapeutic Moisturizing Lotion with Alpha Hydroxy  Curel Extreme Care Body Lotion  Curel Soothing Hands Moisturizing Hand Lotion  Curel Therapeutic Moisturizing Cream, Fragrance-Free  Curel Therapeutic Moisturizing Lotion, Fragrance-Free  Curel Therapeutic Moisturizing Lotion, Original Formula  Eucerin Daily Replenishing Lotion  Eucerin Dry Skin Therapy Plus Alpha Hydroxy Crme  Eucerin Dry Skin Therapy Plus Alpha Hydroxy Lotion  Eucerin Original Crme  Eucerin Original Lotion  Eucerin Plus Crme Eucerin Plus Lotion  Eucerin TriLipid Replenishing Lotion  Keri Anti-Bacterial Hand Lotion  Keri Deep Conditioning Original Lotion Dry Skin Formula Softly Scented  Keri Deep Conditioning Original Lotion, Fragrance Free Sensitive Skin Formula  Keri  Lotion Fast Absorbing Fragrance Free Sensitive Skin Formula  Keri Lotion Fast Absorbing Softly Scented Dry Skin Formula  Keri Original Lotion  Keri Skin Renewal Lotion Keri Silky Smooth Lotion  Keri Silky Smooth Sensitive Skin Lotion  Nivea Body Creamy Conditioning Oil  Nivea Body Extra Enriched Teacher, Adult Education Moisturizing Lotion Nivea Crme  Nivea Skin Firming Lotion  NutraDerm 30 Skin Lotion  NutraDerm Skin Lotion  NutraDerm Therapeutic Skin Cream  NutraDerm Therapeutic Skin Lotion  ProShield Protective Hand Cream  Provon moisturizing lotion

## 2024-05-21 NOTE — Progress Notes (Signed)
 Anesthesia Review:  ERE:Tpoopjf Sheena Lesches Cardiologist : REvankar LOV 08/24/23   PPM/ ICD: Device Orders: Rep Notified:  Chest x-ray : EKG :08/24/23  Echo :09/22/23  Stress test: 2023  Cardiac Cath :   Activity level: can do a flight of stairs without difficutly  Sleep Study/ CPAP : none  Fasting Blood Sugar :      / Checks Blood Sugar -- times a day:    Blood Thinner/ Instructions /Last Dose: ASA / Instructions/ Last Dose :    Retired [employer]

## 2024-05-22 ENCOUNTER — Encounter (HOSPITAL_COMMUNITY): Payer: Self-pay

## 2024-05-22 ENCOUNTER — Other Ambulatory Visit: Payer: Self-pay

## 2024-05-22 ENCOUNTER — Encounter (HOSPITAL_COMMUNITY)
Admission: RE | Admit: 2024-05-22 | Discharge: 2024-05-22 | Disposition: A | Source: Ambulatory Visit | Attending: Orthopedic Surgery

## 2024-05-22 VITALS — BP 136/65 | HR 81 | Temp 98.4°F | Resp 16 | Ht 60.0 in | Wt 133.0 lb

## 2024-05-22 DIAGNOSIS — I10 Essential (primary) hypertension: Secondary | ICD-10-CM | POA: Insufficient documentation

## 2024-05-22 DIAGNOSIS — Z01818 Encounter for other preprocedural examination: Secondary | ICD-10-CM

## 2024-05-22 DIAGNOSIS — M1711 Unilateral primary osteoarthritis, right knee: Secondary | ICD-10-CM | POA: Insufficient documentation

## 2024-05-22 DIAGNOSIS — Z01812 Encounter for preprocedural laboratory examination: Secondary | ICD-10-CM | POA: Insufficient documentation

## 2024-05-22 DIAGNOSIS — I080 Rheumatic disorders of both mitral and aortic valves: Secondary | ICD-10-CM | POA: Insufficient documentation

## 2024-05-22 DIAGNOSIS — I700 Atherosclerosis of aorta: Secondary | ICD-10-CM | POA: Insufficient documentation

## 2024-05-22 HISTORY — DX: Nausea with vomiting, unspecified: R11.2

## 2024-05-22 LAB — BASIC METABOLIC PANEL WITH GFR
Anion gap: 11 (ref 5–15)
BUN: 26 mg/dL — ABNORMAL HIGH (ref 8–23)
CO2: 24 mmol/L (ref 22–32)
Calcium: 9.4 mg/dL (ref 8.9–10.3)
Chloride: 102 mmol/L (ref 98–111)
Creatinine, Ser: 0.91 mg/dL (ref 0.44–1.00)
GFR, Estimated: >60 mL/min
Glucose, Bld: 110 mg/dL — ABNORMAL HIGH (ref 70–99)
Potassium: 4.9 mmol/L (ref 3.5–5.1)
Sodium: 137 mmol/L (ref 135–145)

## 2024-05-22 LAB — CBC
HCT: 35.3 % — ABNORMAL LOW (ref 36.0–46.0)
Hemoglobin: 11.6 g/dL — ABNORMAL LOW (ref 12.0–15.0)
MCH: 31.3 pg (ref 26.0–34.0)
MCHC: 32.9 g/dL (ref 30.0–36.0)
MCV: 95.1 fL (ref 80.0–100.0)
Platelets: 448 10*3/uL — ABNORMAL HIGH (ref 150–400)
RBC: 3.71 MIL/uL — ABNORMAL LOW (ref 3.87–5.11)
RDW: 13.7 % (ref 11.5–15.5)
WBC: 6.7 10*3/uL (ref 4.0–10.5)
nRBC: 0 % (ref 0.0–0.2)

## 2024-05-23 LAB — SURGICAL PCR SCREEN
MRSA, PCR: NEGATIVE
Staphylococcus aureus: NEGATIVE

## 2024-05-23 NOTE — Progress Notes (Signed)
 " Case: [id number] Date/Time: 05/29/24 1145   Procedure: ARTHROPLASTY, KNEE, TOTAL (Right: Knee)   Anesthesia type: Epidural   Diagnosis: Primary osteoarthritis of right knee [M17.11]   Pre-op diagnosis: Primary osteoarthritis of right knee   Location: [facility] ROOM 08 / [facility]   Surgeons: Josefina Chew, MD        DISCUSSION: Patricia [name] is an 89 yo female with PMH of HTN, moderate AI, GERD, CKD 3, chronic anemia, spinal stenosis, arthritis, PONV  Patient seen by [facility] cardiology for hypertension, aortic atherosclerosis, and moderate aortic regurgitation.  Last seen on 08/24/2023.  Noted to be stable at that visit.  Repeat echo was ordered to follow-up on her aortic regurgitation.  This was done on 09/22/2023 and showed normal LVEF, grade 1 diastolic dysfunction, mild mitral regurgitation, mild to moderate AI.  She had a cardiac clearance televisit on 03/21/2024.  She denies any symptoms.  Cleared for surgery:  Preoperative Cardiovascular Risk Assessment:   According to the Revised Cardiac Risk Index (RCRI), her Perioperative Risk of Major Cardiac Event is (%): 0.4. Her Functional Capacity in METs is: 5.29 according to the [employer] Activity Status Index (DASI). Therefore, based on ACC/AHA guidelines, patient would be at acceptable risk for the planned procedure without further cardiovascular testing. I will route this recommendation to the requesting party via Epic fax function.   VS: BP 136/65   Pulse 81   Temp 36.9 C (Oral)   Resp 16   Ht 5' (1.524 m)   Wt 60.3 kg   SpO2 97%   BMI 25.97 kg/m   PROVIDERS: Arloa Elsie SAUNDERS, MD   LABS: Labs reviewed: Acceptable for surgery. (all labs ordered are listed, but only abnormal results are displayed)  Labs Reviewed  BASIC METABOLIC PANEL WITH GFR - Abnormal; Notable for the following components:      Result Value   Glucose, Bld 110 (*)    BUN 26 (*)    All other components within normal limits  CBC - Abnormal; Notable for the following  components:   RBC 3.71 (*)    Hemoglobin 11.6 (*)    HCT 35.3 (*)    Platelets 448 (*)    All other components within normal limits  SURGICAL PCR SCREEN     EKG 08/24/2023:  Sinus rhythm with PACs   Echo 09/22/2023:  IMPRESSIONS    1. Left ventricular ejection fraction, by estimation, is 60 to 65%. The left ventricle has normal function. The left ventricle has no regional wall motion abnormalities. Left ventricular diastolic parameters are consistent with Grade I diastolic dysfunction (impaired relaxation).  2. Right ventricular systolic function is normal. The right ventricular size is normal. There is normal pulmonary artery systolic pressure.  3. The mitral valve is degenerative. Mild mitral valve regurgitation. No evidence of mitral stenosis. The mean mitral valve gradient is 2.0 mmHg with average heart rate of 74 bpm. Moderate mitral annular calcification.  4. The aortic valve is tricuspid. There is mild calcification of the aortic valve. There is moderate thickening of the aortic valve. Aortic valve regurgitation is mild to moderate. No aortic stenosis is present. Aortic valve Vmax measures 1.79 m/s.  5. The inferior vena cava is normal in size with greater than 50% respiratory variability, suggesting right atrial pressure of 3 mmHg.  Comparison(s): Echocardiogram done 09/02/22 showed an EF of 60-65% with moderate AI, mild MR.  Stress test 05/19/2021:    The study is normal. The study is low risk.   No ST  deviation was noted.   Left ventricular function is normal. Nuclear stress EF: 78 %. The left ventricular ejection fraction is hyperdynamic (>65%). End diastolic cavity size is normal.   Prior study not available for comparison.   Low risk stress nuclear study with normal perfusion and normal left ventricular regional and global systolic function.   Past Medical History:  Diagnosis Date   Abnormal gait 04/24/2021   Age-related macular degeneration, dry, both eyes  03/08/2016   Allergies    Anemia    Aortic atherosclerosis 05/01/2021   Arthritis    Bilateral presbyopia 03/05/2015   Chest pain of uncertain etiology 05/01/2021   Chronic pain of left ankle 04/24/2021   Complication of anesthesia    nausea from fentayl; hypotension episode after previous shoulder surgery   Constipation    Decreased range of motion of ankle 04/24/2021   Degenerative arthritis of right shoulder region 03/06/2011   DJD (degenerative joint disease)    Essential (primary) hypertension    Gastro-esophageal reflux disease without esophagitis 04/24/2021   GERD with esophagitis    Glaucoma    History of bronchitis    History of kidney stones    History of pneumonia    Hyperlipidemia 04/24/2021   Hypertension    Hyperthyroidism    Impaired functional mobility, balance, and endurance 04/24/2021   Iron deficiency anemia    Left rotator cuff tear arthropathy 12/09/2015   Liver nodule    Localized, primary osteoarthritis of shoulder region 05/01/2021   Low back pain    Lower abdominal pain 05/01/2021   Macular degeneration disease    Moderate aortic regurgitation 08/10/2022   Nonexudative age-related macular degeneration 03/05/2015   Normal tension glaucoma of both eyes, mild stage 03/05/2015   Numbness    left foot and toes   Osteoarthritis    Other chest pain    PONV (postoperative nausea and vomiting)    Postoperative anemia due to acute blood loss 03/10/2011   Had pre-existing anemia, now symptomatic with acute on chronic anemia.   Primary insomnia    Primary osteoarthritis 04/24/2021   Pseudophakia of both eyes 03/05/2015   Pure hypercholesterolemia    Rectal bleeding    Renal insufficiency    Rosacea    Rotator cuff tear, right 03/06/2011   S/P shoulder replacement 12/09/2015   Sleep disorder, unspecified    Spinal stenosis    Spinal stenosis    Spinal stenosis    Stage 3a chronic kidney disease (HCC)    Synovitis of left ankle 04/24/2021    Past  Surgical History:  Procedure Laterality Date   ABDOMINAL HYSTERECTOMY     CARPAL TUNNEL RELEASE     DILATION AND CURETTAGE OF UTERUS     EYE SURGERY Bilateral    cataract   JOINT REPLACEMENT     lt knee-05   KNEE SURGERY     REVERSE SHOULDER ARTHROPLASTY  03/09/2011   Procedure: REVERSE SHOULDER ARTHROPLASTY;  Surgeon: Fonda SHAUNNA Olmsted;  Location: [facility];  Service: Orthopedics;  Laterality: Right;   REVERSE SHOULDER ARTHROPLASTY Left 12/09/2015   Procedure: REVERSE TOTAL SHOULDER ARTHROPLASTY;  Surgeon: Fonda Olmsted, MD;  Location: [facility];  Service: Orthopedics;  Laterality: Left;   ROTATOR CUFF REPAIR, left      MEDICATIONS:  ascorbic acid (VITAMIN C) 1000 MG tablet   clotrimazole-betamethasone  (LOTRISONE) cream   Docusate Sodium  100 MG capsule   GLUTATHIONE PO   irbesartan (AVAPRO) 75 MG tablet   KRILL OIL PO  levothyroxine  (EUTHYROX ) 125 MCG tablet   LINZESS 145 MCG CAPS capsule   MAGNESIUM  PO   Multiple Vitamins-Minerals (PRESERVISION AREDS) CAPS   OVER THE COUNTER MEDICATION   OVER THE COUNTER MEDICATION   Probiotic Product (PROBIOTIC PO)   Vitamin D -Vitamin K (VITAMIN K2-VITAMIN D3 PO)   zinc gluconate 50 MG tablet   No current facility-administered medications for this encounter.   Burnard CHRISTELLA Odis DEVONNA [facility] Short Stay/Anesthesiology [facility] Phone [phone] 05/23/2024 1:44 PM       "

## 2024-05-23 NOTE — Anesthesia Preprocedure Evaluation (Signed)
"                                    Anesthesia Evaluation    Airway        Dental   Pulmonary           Cardiovascular hypertension,      Neuro/Psych    GI/Hepatic   Endo/Other    Renal/GU      Musculoskeletal   Abdominal   Peds  Hematology   Anesthesia Other Findings   Reproductive/Obstetrics                              Anesthesia Physical Anesthesia Plan  ASA:   Anesthesia Plan:    Post-op Pain Management:    Induction:   PONV Risk Score and Plan:   Airway Management Planned:   Additional Equipment:   Intra-op Plan:   Post-operative Plan:   Informed Consent:   Plan Discussed with:   Anesthesia Plan Comments: (See PAT note from 2/3)         Anesthesia Quick Evaluation  "

## 2024-05-29 ENCOUNTER — Encounter (HOSPITAL_COMMUNITY): Payer: Self-pay | Admitting: Medical

## 2024-05-29 ENCOUNTER — Encounter (HOSPITAL_COMMUNITY): Admission: RE | Payer: Self-pay | Source: Ambulatory Visit

## 2024-05-29 ENCOUNTER — Ambulatory Visit (HOSPITAL_COMMUNITY): Admission: RE | Admit: 2024-05-29 | Source: Ambulatory Visit | Admitting: Orthopedic Surgery
